# Patient Record
Sex: Male | Born: 1955 | Race: White | Hispanic: No | Marital: Single | Smoking: Former smoker
Health system: Southern US, Community
[De-identification: ages and names within clinical notes are randomized; demographics above are authoritative.]

## PROBLEM LIST (undated history)

## (undated) DIAGNOSIS — H53149 Visual discomfort, unspecified: Secondary | ICD-10-CM

## (undated) DIAGNOSIS — Z7901 Long term (current) use of anticoagulants: Secondary | ICD-10-CM

## (undated) DIAGNOSIS — I739 Peripheral vascular disease, unspecified: Secondary | ICD-10-CM

## (undated) DIAGNOSIS — S4990XA Unspecified injury of shoulder and upper arm, unspecified arm, initial encounter: Secondary | ICD-10-CM

## (undated) DIAGNOSIS — R0989 Other specified symptoms and signs involving the circulatory and respiratory systems: Secondary | ICD-10-CM

## (undated) DIAGNOSIS — M79641 Pain in right hand: Secondary | ICD-10-CM

## (undated) DIAGNOSIS — Z8371 Family history of colonic polyps: Secondary | ICD-10-CM

## (undated) DIAGNOSIS — Z83719 Family history of colon polyps, unspecified: Secondary | ICD-10-CM

## (undated) DIAGNOSIS — I447 Left bundle-branch block, unspecified: Secondary | ICD-10-CM

## (undated) DIAGNOSIS — F419 Anxiety disorder, unspecified: Secondary | ICD-10-CM

## (undated) DIAGNOSIS — M48061 Spinal stenosis, lumbar region without neurogenic claudication: Secondary | ICD-10-CM

## (undated) DIAGNOSIS — F329 Major depressive disorder, single episode, unspecified: Secondary | ICD-10-CM

## (undated) DIAGNOSIS — Z87898 Personal history of other specified conditions: Secondary | ICD-10-CM

## (undated) DIAGNOSIS — Z1211 Encounter for screening for malignant neoplasm of colon: Secondary | ICD-10-CM

## (undated) DIAGNOSIS — Z87891 Personal history of nicotine dependence: Secondary | ICD-10-CM

## (undated) DIAGNOSIS — I10 Essential (primary) hypertension: Secondary | ICD-10-CM

## (undated) DIAGNOSIS — J449 Chronic obstructive pulmonary disease, unspecified: Secondary | ICD-10-CM

## (undated) DIAGNOSIS — I6522 Occlusion and stenosis of left carotid artery: Secondary | ICD-10-CM

## (undated) DIAGNOSIS — G4700 Insomnia, unspecified: Secondary | ICD-10-CM

## (undated) DIAGNOSIS — R918 Other nonspecific abnormal finding of lung field: Secondary | ICD-10-CM

## (undated) DIAGNOSIS — E785 Hyperlipidemia, unspecified: Secondary | ICD-10-CM

## (undated) DIAGNOSIS — F32A Depression, unspecified: Secondary | ICD-10-CM

## (undated) HISTORY — DX: Insomnia, unspecified: G47.00

## (undated) HISTORY — DX: Unspecified injury of shoulder and upper arm, unspecified arm, initial encounter: S49.90XA

## (undated) HISTORY — DX: Depression, unspecified: F32.A

## (undated) HISTORY — DX: Spinal stenosis, lumbar region without neurogenic claudication: M48.061

## (undated) HISTORY — DX: Family history of colonic polyps: Z83.71

## (undated) HISTORY — DX: Pain in right hand: M79.641

## (undated) HISTORY — DX: Hyperlipidemia, unspecified: E78.5

## (undated) HISTORY — DX: Major depressive disorder, single episode, unspecified: F32.9

## (undated) HISTORY — DX: Encounter for screening for malignant neoplasm of colon: Z12.11

## (undated) HISTORY — DX: Anxiety disorder, unspecified: F41.9

## (undated) HISTORY — DX: Family history of colon polyps, unspecified: Z83.719

## (undated) HISTORY — DX: Visual discomfort, unspecified: H53.149

## (undated) HISTORY — DX: Personal history of other specified conditions: Z87.898

## (undated) HISTORY — DX: Essential (primary) hypertension: I10

---
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
## 1974-11-03 DIAGNOSIS — S4990XA Unspecified injury of shoulder and upper arm, unspecified arm, initial encounter: Secondary | ICD-10-CM

## 1974-11-03 HISTORY — DX: Unspecified injury of shoulder and upper arm, unspecified arm, initial encounter: S49.90XA

## 1977-11-03 HISTORY — PX: KNEE SURGERY: SHX244

## 2002-08-06 ENCOUNTER — Encounter: Payer: Self-pay | Admitting: Neurosurgery

## 2002-08-06 ENCOUNTER — Ambulatory Visit (HOSPITAL_COMMUNITY): Admission: RE | Admit: 2002-08-06 | Discharge: 2002-08-06 | Payer: Self-pay | Admitting: Neurosurgery

## 2003-04-25 ENCOUNTER — Encounter: Admission: RE | Admit: 2003-04-25 | Discharge: 2003-04-25 | Payer: Self-pay | Admitting: Internal Medicine

## 2003-05-02 ENCOUNTER — Encounter: Admission: RE | Admit: 2003-05-02 | Discharge: 2003-05-02 | Payer: Self-pay | Admitting: Internal Medicine

## 2003-06-19 ENCOUNTER — Encounter: Admission: RE | Admit: 2003-06-19 | Discharge: 2003-06-19 | Payer: Self-pay | Admitting: Internal Medicine

## 2003-08-07 ENCOUNTER — Encounter: Admission: RE | Admit: 2003-08-07 | Discharge: 2003-08-07 | Payer: Self-pay | Admitting: Internal Medicine

## 2003-09-27 ENCOUNTER — Encounter: Admission: RE | Admit: 2003-09-27 | Discharge: 2003-09-27 | Payer: Self-pay | Admitting: Internal Medicine

## 2003-11-23 ENCOUNTER — Encounter: Admission: RE | Admit: 2003-11-23 | Discharge: 2003-11-23 | Payer: Self-pay | Admitting: Internal Medicine

## 2004-04-12 ENCOUNTER — Encounter: Admission: RE | Admit: 2004-04-12 | Discharge: 2004-04-12 | Payer: Self-pay | Admitting: Internal Medicine

## 2004-06-24 ENCOUNTER — Encounter: Admission: RE | Admit: 2004-06-24 | Discharge: 2004-06-24 | Payer: Self-pay | Admitting: Internal Medicine

## 2004-08-12 ENCOUNTER — Ambulatory Visit: Payer: Self-pay | Admitting: Internal Medicine

## 2005-11-03 DIAGNOSIS — I10 Essential (primary) hypertension: Secondary | ICD-10-CM

## 2005-11-03 HISTORY — PX: CARDIAC CATHETERIZATION: SHX172

## 2005-11-03 HISTORY — DX: Essential (primary) hypertension: I10

## 2006-07-14 ENCOUNTER — Ambulatory Visit: Payer: Self-pay | Admitting: Internal Medicine

## 2006-07-15 ENCOUNTER — Ambulatory Visit: Payer: Self-pay | Admitting: Internal Medicine

## 2006-07-30 ENCOUNTER — Ambulatory Visit: Payer: Self-pay | Admitting: Cardiovascular Disease

## 2006-10-08 ENCOUNTER — Ambulatory Visit: Payer: Self-pay | Admitting: Internal Medicine

## 2006-10-08 IMAGING — US ABDOMEN ULTRASOUND
1 series · 17 of 25 positions shown · non-contrast
Comparison: none

REASON FOR EXAM: Pain upper abd
COMMENTS:

[Series 1: abdomen ultrasound · 17 of 75 slices shown]
[im 1/75]
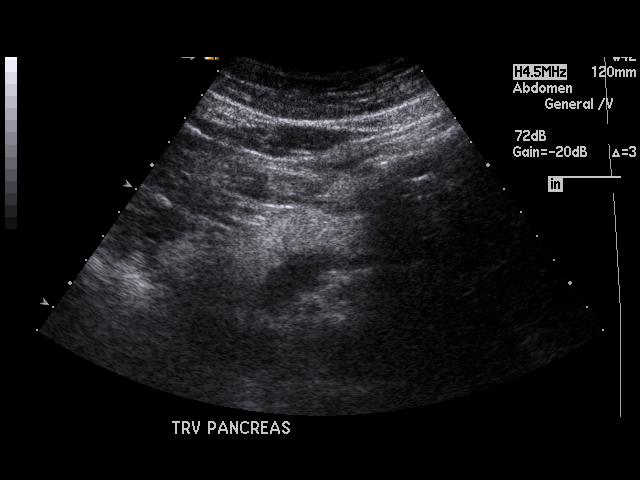
[im 7/75]
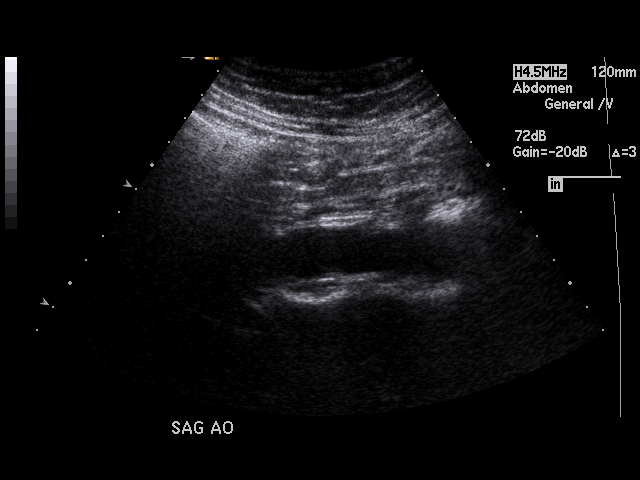
[im 10/75]
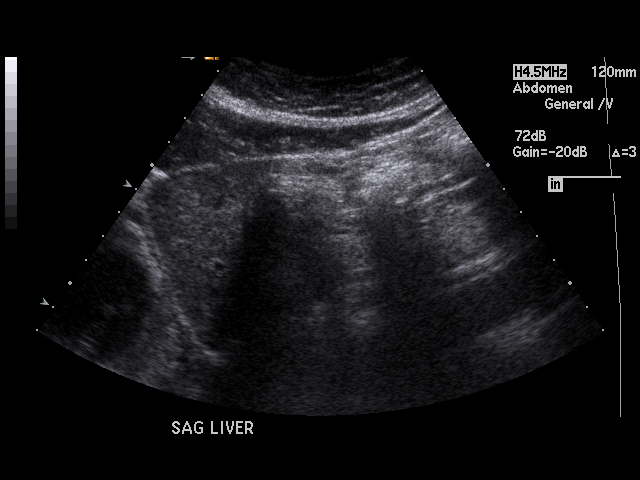
[im 16/75]
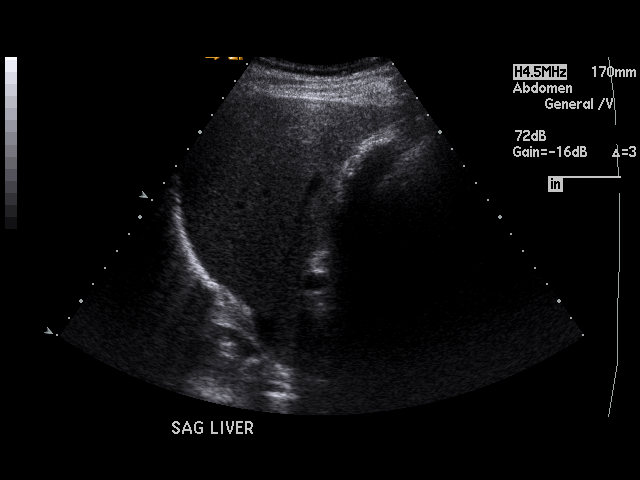
[im 19/75]
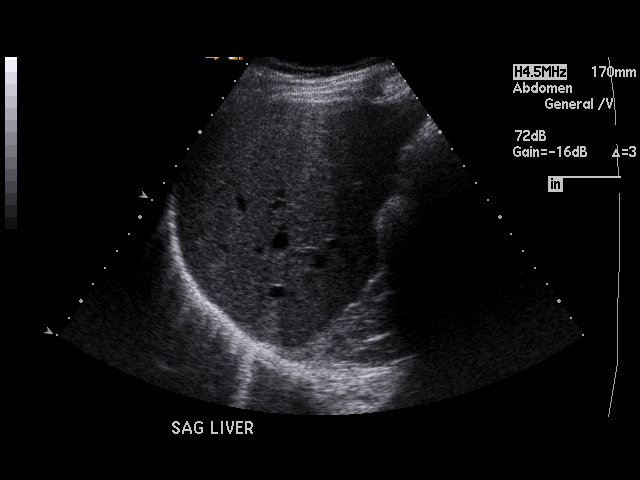
[im 25/75]
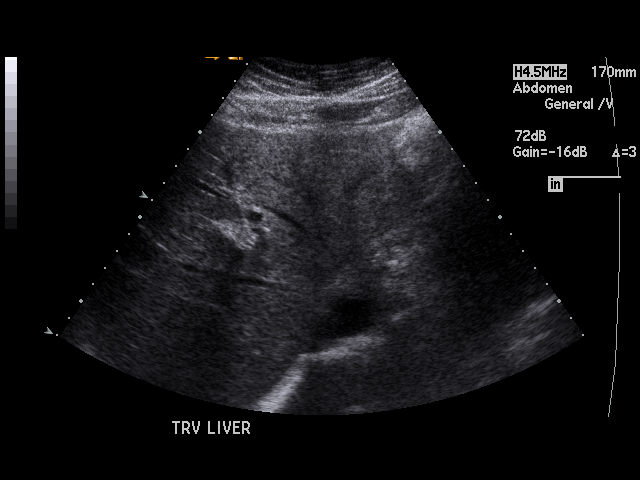
[im 28/75]
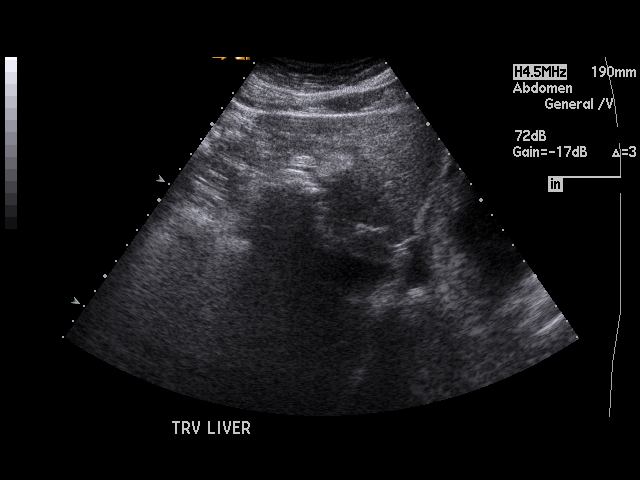
[im 34/75]
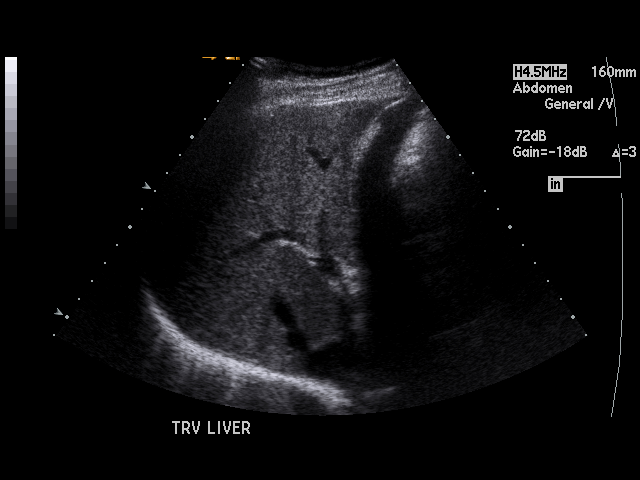
[im 38/75]
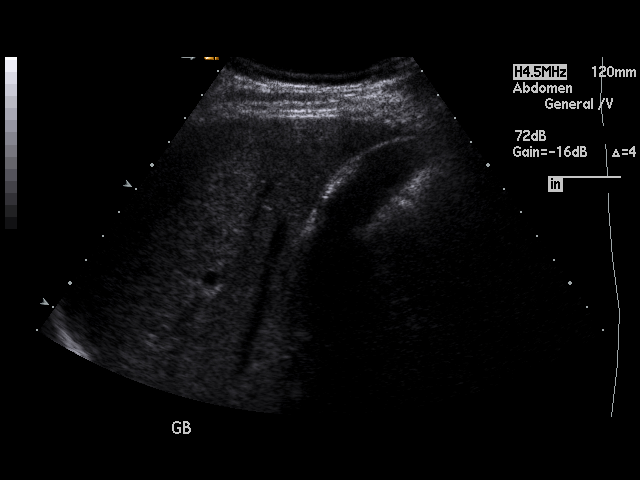
[im 41/75]
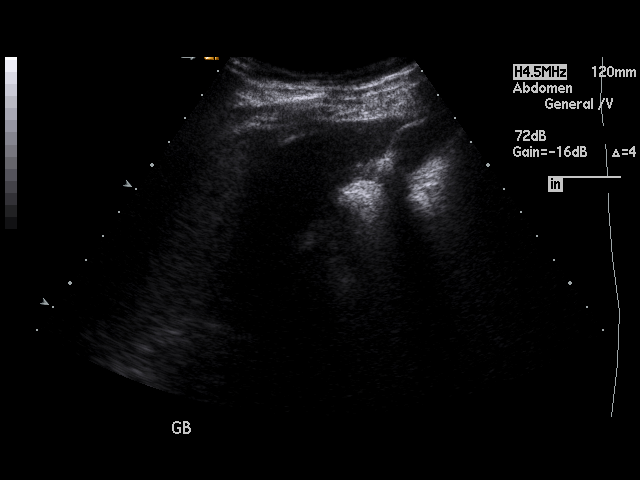
[im 47/75]
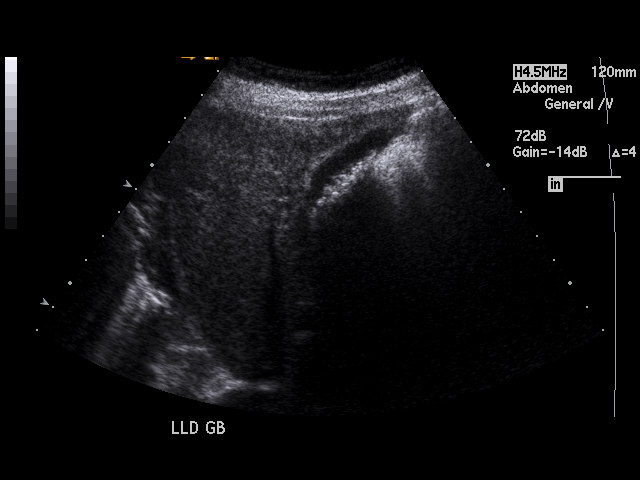
[im 50/75]
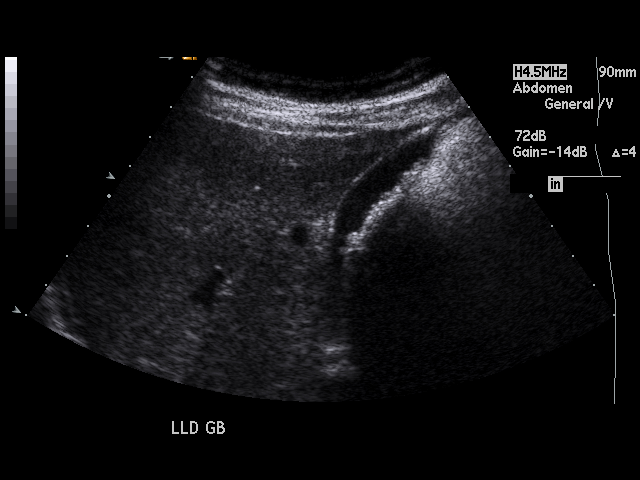
[im 56/75]
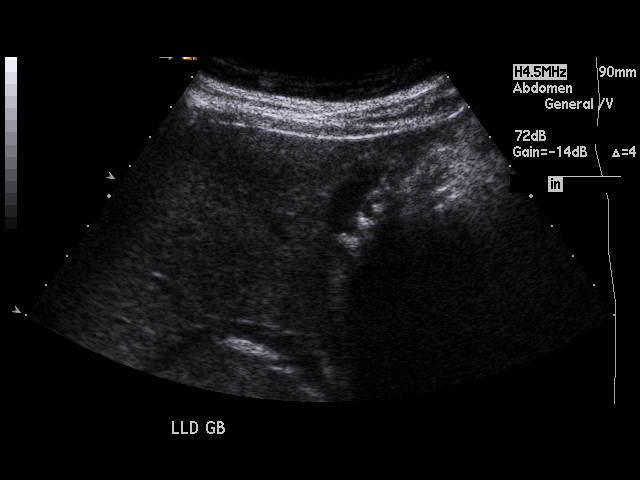
[im 59/75]
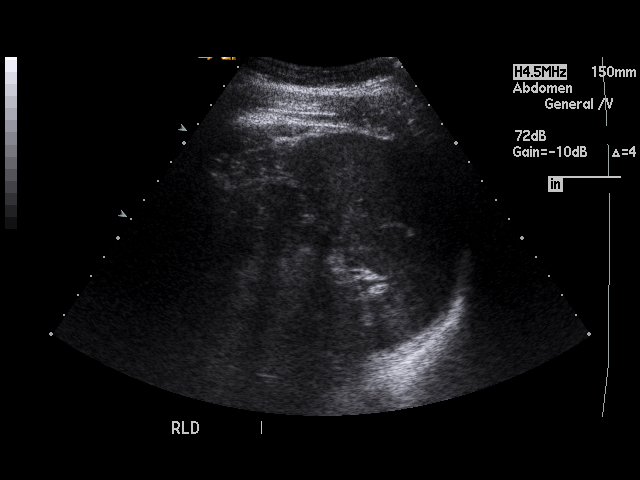
[im 65/75]
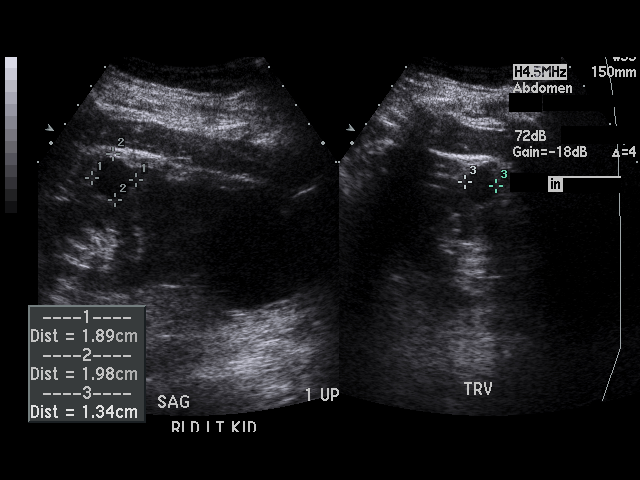
[im 68/75]
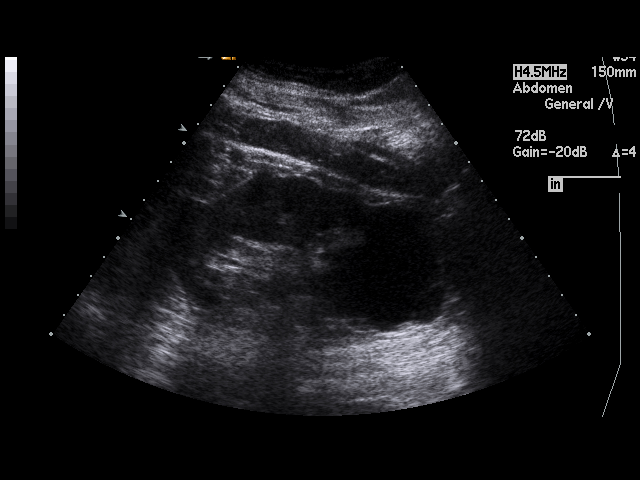
[im 75/75]
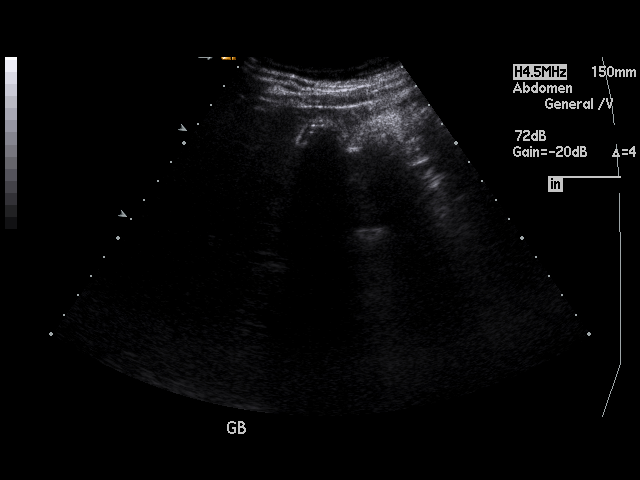

[17 of 25 positions shown; findings below may reference images not displayed]

PROCEDURE:     US  - US ABDOMEN GENERAL SURVEY  - [DATE]  [DATE]

RESULT:       The liver and spleen are normal in appearance.  A portion of
the pancreatic tail is obscured but otherwise the pancreas is normal in
appearance.  The abdominal aorta shows no significant abnormalities.  There
are noted echo densities in the gallbladder compatible with gallstones.  No
thickening of the gallbladder wall is seen.  The common bile duct measures
4.1 mm in diameter which is within normal limits.  The kidneys show no
hydronephrosis.  There is a 5.54 cm septated cyst of the lower pole of the
LEFT kidney.   There is a 1.98 cm cyst of the upper pole of the RIGHT
kidney.   No hydronephrosis is seen.
IMPRESSION: 1.     Cholelithiasis.
2.     Bilateral renal cysts are noted.

## 2009-08-17 ENCOUNTER — Ambulatory Visit: Payer: Self-pay

## 2011-11-04 HISTORY — PX: COLONOSCOPY: SHX174

## 2011-11-04 HISTORY — PX: POLYPECTOMY: SHX149

## 2011-12-30 ENCOUNTER — Ambulatory Visit: Payer: Self-pay | Admitting: General Surgery

## 2012-01-01 LAB — PATHOLOGY REPORT

## 2012-03-17 ENCOUNTER — Emergency Department: Payer: Self-pay | Admitting: Unknown Physician Specialty

## 2012-03-17 LAB — URINALYSIS, COMPLETE
Bacteria: NONE SEEN
Bilirubin,UR: NEGATIVE
Blood: NEGATIVE
Glucose,UR: NEGATIVE mg/dL
Ketone: NEGATIVE
Leukocyte Esterase: NEGATIVE
Nitrite: NEGATIVE
Ph: 6
Protein: NEGATIVE
RBC,UR: <1 /HPF
Specific Gravity: 1.004
Squamous Epithelial: NONE SEEN
WBC UR: <1 /HPF

## 2012-03-17 LAB — COMPREHENSIVE METABOLIC PANEL
Alkaline Phosphatase: 69 U/L (ref 50–136)
BUN: 6 mg/dL — ABNORMAL LOW (ref 7–18)
Bilirubin,Total: 0.5 mg/dL (ref 0.2–1.0)
Chloride: 93 mmol/L — ABNORMAL LOW (ref 98–107)
Co2: 27 mmol/L (ref 21–32)
Creatinine: 0.77 mg/dL (ref 0.60–1.30)
Glucose: 101 mg/dL — ABNORMAL HIGH (ref 65–99)
Osmolality: 247 (ref 275–301)
SGPT (ALT): 16 U/L

## 2012-03-17 LAB — CBC
HCT: 44.4 %
HGB: 15.2 g/dL
MCH: 29.9 pg
MCHC: 34.3 g/dL
MCV: 87 fL
Platelet: 236 x10 3/mm 3
RBC: 5.1 x10 6/mm 3
RDW: 13.2 %
WBC: 7.7 x10 3/mm 3

## 2012-03-17 LAB — TROPONIN I: Troponin-I: <0.02 ng/mL

## 2012-03-17 LAB — CK TOTAL AND CKMB (NOT AT ARMC)
CK, Total: 55 U/L (ref 35–232)
CK-MB: 0.5 ng/mL (ref 0.5–3.6)

## 2012-04-08 ENCOUNTER — Other Ambulatory Visit: Payer: Self-pay | Admitting: Internal Medicine

## 2012-04-08 LAB — COMPREHENSIVE METABOLIC PANEL
Albumin: 4.1 g/dL (ref 3.4–5.0)
Alkaline Phosphatase: 92 U/L (ref 50–136)
Anion Gap: 7 (ref 7–16)
BUN: 7 mg/dL (ref 7–18)
Bilirubin,Total: 0.4 mg/dL (ref 0.2–1.0)
Calcium, Total: 9 mg/dL (ref 8.5–10.1)
Chloride: 105 mmol/L (ref 98–107)
Co2: 27 mmol/L (ref 21–32)
Creatinine: 0.97 mg/dL (ref 0.60–1.30)
EGFR (African American): >60
EGFR (Non-African Amer.): >60
Glucose: 88 mg/dL (ref 65–99)
Osmolality: 275 (ref 275–301)
Potassium: 4.9 mmol/L (ref 3.5–5.1)
SGOT(AST): 15 U/L (ref 15–37)
SGPT (ALT): 15 U/L
Sodium: 139 mmol/L (ref 136–145)
Total Protein: 7.6 g/dL (ref 6.4–8.2)

## 2012-04-08 LAB — CBC WITH DIFFERENTIAL/PLATELET
Basophil #: 0.1 10*3/uL (ref 0.0–0.1)
Basophil %: 0.9 %
Eosinophil #: 0.1 10*3/uL (ref 0.0–0.7)
Eosinophil %: 1.8 %
HCT: 43.9 % (ref 40.0–52.0)
HGB: 14.6 g/dL (ref 13.0–18.0)
Lymphocyte #: 1.2 10*3/uL (ref 1.0–3.6)
Lymphocyte %: 19.1 %
MCH: 29.4 pg (ref 26.0–34.0)
MCHC: 33.3 g/dL (ref 32.0–36.0)
MCV: 88 fL (ref 80–100)
Monocyte #: 0.4 x10 3/mm (ref 0.2–1.0)
Monocyte %: 6.6 %
Neutrophil #: 4.5 10*3/uL (ref 1.4–6.5)
Neutrophil %: 71.6 %
Platelet: 213 10*3/uL (ref 150–440)
RBC: 4.96 10*6/uL (ref 4.40–5.90)
RDW: 13.1 % (ref 11.5–14.5)
WBC: 6.3 10*3/uL (ref 3.8–10.6)

## 2012-04-08 LAB — SEDIMENTATION RATE: Erythrocyte Sed Rate: 1 mm/hr (ref 0–20)

## 2012-05-26 ENCOUNTER — Emergency Department: Payer: Self-pay | Admitting: Emergency Medicine

## 2012-05-26 LAB — CK TOTAL AND CKMB (NOT AT ARMC)
CK, Total: 55 U/L (ref 35–232)
CK-MB: 0.7 ng/mL (ref 0.5–3.6)

## 2012-05-26 LAB — COMPREHENSIVE METABOLIC PANEL
Albumin: 4.1 g/dL (ref 3.4–5.0)
Alkaline Phosphatase: 86 U/L (ref 50–136)
Anion Gap: 11 (ref 7–16)
BUN: 5 mg/dL — ABNORMAL LOW (ref 7–18)
Bilirubin,Total: 0.6 mg/dL (ref 0.2–1.0)
Calcium, Total: 9 mg/dL (ref 8.5–10.1)
Chloride: 96 mmol/L — ABNORMAL LOW (ref 98–107)
Co2: 24 mmol/L (ref 21–32)
Creatinine: 1.02 mg/dL (ref 0.60–1.30)
EGFR (African American): >60
EGFR (Non-African Amer.): >60
Glucose: 125 mg/dL — ABNORMAL HIGH (ref 65–99)
Osmolality: 261 (ref 275–301)
Potassium: 3.3 mmol/L — ABNORMAL LOW (ref 3.5–5.1)
SGOT(AST): 9 U/L — ABNORMAL LOW (ref 15–37)
SGPT (ALT): 14 U/L
Sodium: 131 mmol/L — ABNORMAL LOW (ref 136–145)
Total Protein: 7.3 g/dL (ref 6.4–8.2)

## 2012-05-26 LAB — URINALYSIS, COMPLETE
Bacteria: NONE SEEN
Bilirubin,UR: NEGATIVE
Blood: NEGATIVE
Glucose,UR: NEGATIVE mg/dL (ref 0–75)
Hyaline Cast: 3
Leukocyte Esterase: NEGATIVE
Nitrite: NEGATIVE
Ph: 6 (ref 4.5–8.0)
Protein: NEGATIVE
RBC,UR: NONE SEEN /HPF (ref 0–5)
Specific Gravity: 1.004 (ref 1.003–1.030)
Squamous Epithelial: NONE SEEN
WBC UR: <1 /HPF (ref 0–5)

## 2012-05-26 LAB — CBC
HCT: 45.9 % (ref 40.0–52.0)
HGB: 15.1 g/dL (ref 13.0–18.0)
MCH: 29 pg (ref 26.0–34.0)
MCHC: 33 g/dL (ref 32.0–36.0)
MCV: 88 fL (ref 80–100)
Platelet: 201 10*3/uL (ref 150–440)
RBC: 5.22 10*6/uL (ref 4.40–5.90)
RDW: 13.7 % (ref 11.5–14.5)
WBC: 6.9 10*3/uL (ref 3.8–10.6)

## 2012-05-26 LAB — PROTIME-INR
INR: 0.9
Prothrombin Time: 12.3 secs (ref 11.5–14.7)

## 2012-05-26 LAB — TROPONIN I: Troponin-I: <0.02 ng/mL

## 2013-05-19 ENCOUNTER — Encounter: Payer: Self-pay | Admitting: *Deleted

## 2015-10-09 ENCOUNTER — Ambulatory Visit (INDEPENDENT_AMBULATORY_CARE_PROVIDER_SITE_OTHER): Payer: Medicaid Other | Admitting: Family Medicine

## 2015-10-09 ENCOUNTER — Encounter: Payer: Self-pay | Admitting: Family Medicine

## 2015-10-09 ENCOUNTER — Ambulatory Visit
Admission: RE | Admit: 2015-10-09 | Discharge: 2015-10-09 | Disposition: A | Discharge status: Home / Self Care | Payer: Medicaid Other | Source: Ambulatory Visit | Attending: Family Medicine | Admitting: Family Medicine

## 2015-10-09 VITALS — BP 150/70 | HR 104 | Temp 98.2°F | Resp 18 | Ht 66.0 in | Wt 215.5 lb

## 2015-10-09 DIAGNOSIS — M7989 Other specified soft tissue disorders: Secondary | ICD-10-CM | POA: Insufficient documentation

## 2015-10-09 DIAGNOSIS — R0609 Other forms of dyspnea: Principal | ICD-10-CM

## 2015-10-09 DIAGNOSIS — R0600 Dyspnea, unspecified: Secondary | ICD-10-CM | POA: Diagnosis present

## 2015-10-09 DIAGNOSIS — I10 Essential (primary) hypertension: Secondary | ICD-10-CM | POA: Insufficient documentation

## 2015-10-09 DIAGNOSIS — I447 Left bundle-branch block, unspecified: Secondary | ICD-10-CM

## 2015-10-09 MED ORDER — LISINOPRIL-HYDROCHLOROTHIAZIDE 10-12.5 MG PO TABS: 1.0000 | ORAL_TABLET | Freq: Every day | ORAL | Status: DC

## 2015-10-09 NOTE — Progress Notes (Signed)
Name: James [name]   MRN: [id number]    DOB: 11/05/1955   Date:10/09/2015       Progress Note  Subjective  Chief Complaint  Chief Complaint  Patient presents with  . Establish Care  . Shortness of Breath    has has a cough since Thanksgiving day    Shortness of Breath This is a chronic problem. The current episode started more than 1 year ago (2 years ago). Associated symptoms include leg swelling (right leg swells up sometimes.), sputum production and wheezing. Pertinent negatives include no chest pain (chest wall pain with movement), fever, hemoptysis, leg pain or sore throat. Nothing aggravates the symptoms. There is no history of CAD, chronic lung disease, COPD, a heart failure, pneumonia or a recent surgery.    Past Medical History  Diagnosis Date  . Hypertension 2007  . Family history of colonic polyps   . Special screening for malignant neoplasms, colon   . Depression     history of   . History of palpitations     intermittent; patient states heart skips a beat at times  . Hyperlipidemia   . 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 Shoulder injury 1976  . Anxiety     history of  . Hand pain, right   . Spinal stenosis of lumbar region   . Insomnia     Past Surgical History  Procedure Laterality Date  . Colonoscopy  2013  . Tonsillectomy  1964  . Knee surgery Left 1979  . Polypectomy  2013    Family History  Problem Relation Age of Onset  . Colon cancer Father   . Cancer Father     colon  . Transient ischemic attack Father     history of  . Arthritis Mother   . Heart failure Mother   . Hypertension Mother   . Kidney failure Mother     Social History   Social History  . Marital Status: Single    Spouse Name: N/A  . Number of Children: N/A  . Years of Education: N/A   Occupational History  . Not on file.   Social History Main Topics  . Smoking status: Former Smoker -- 1.00 packs/day for 25 years   . Smokeless tobacco: Never Used  . Alcohol Use: No  . Drug Use: No  . Sexual Activity: No   Other Topics Concern  . Not on file   Social History Narrative    No current outpatient prescriptions on file.  Allergies  Allergen Reactions  . Naprosyn [Naproxen] Itching     Review of Systems  Constitutional: Negative for fever.  HENT: Negative for sore throat.   Respiratory: Positive for cough, sputum production, shortness of breath and wheezing. Negative for hemoptysis.   Cardiovascular: Positive for leg swelling (right leg swells up sometimes.). Negative for chest pain (chest wall pain with movement).    Objective  Filed Vitals:   10/09/15 1335  BP: 150/70  Pulse: 104  Temp: 98.2 F (36.8 C)  TempSrc: Oral  Resp: 18  Height: 5\' 6"  (1.676 m)  Weight: 215 lb 8 oz (97.75 kg)  SpO2: 98%    Physical Exam  Constitutional: He is oriented to person, place, and time and well-developed, well-nourished, and in no distress.  HENT:  Right Ear: Ear canal normal.  Mouth/Throat: Oropharynx is clear and moist.  R. Ear canal has cerumen impaction.  Neck: Neck  supple.  Cardiovascular: Regular rhythm, S1 normal, S2 normal and normal heart sounds.  Tachycardia present.   Pulmonary/Chest: He has no decreased breath sounds. He has no wheezes. He has rales in the right middle field and the right lower field.  Abdominal: Soft. Bowel sounds are normal. There is no tenderness.  Musculoskeletal:       Right knee: He exhibits swelling. No tenderness found.       Left knee: He exhibits no swelling. No tenderness found.       Right lower leg: He exhibits swelling.  Neurological: He is oriented to person, place, and time.  Nursing note and vitals reviewed.   Assessment & Plan  1. Dyspnea on effort Obtain laboratory evaluation, EKG, and chest x-ray to rule out potential etiologies. - CBC with Differential - Comprehensive Metabolic Panel (CMET) - DG Chest 2 View; Future - EKG  12-Lead  2. Essential hypertension BP elevated. Patient has been on a beta blocker in the past. We'll start on lisinopril/HCTZ and recheck BP in one month. - lisinopril-hydrochlorothiazide (PRINZIDE,ZESTORETIC) 10-12.5 MG tablet; Take 1 tablet by mouth daily.  Dispense: 30 tablet; Refill: 0  3. Right leg swelling Likely dependent edema but will rule out DVT with ultrasound. - US Venous Img Lower Unilateral Right; Future  4. LBBB (left bundle branch block)  - Ambulatory referral to Cardiology   [facility] 10/09/2015 2:11 PM

## 2015-10-11 LAB — CBC WITH DIFFERENTIAL/PLATELET
BASOS: 1 %
Basophils Absolute: 0 10*3/uL (ref 0.0–0.2)
EOS (ABSOLUTE): 0.4 10*3/uL (ref 0.0–0.4)
EOS: 5 %
HEMATOCRIT: 41.7 % (ref 37.5–51.0)
HEMOGLOBIN: 14.4 g/dL (ref 12.6–17.7)
IMMATURE GRANULOCYTES: 1 %
Immature Grans (Abs): 0 10*3/uL (ref 0.0–0.1)
LYMPHS ABS: 1.7 10*3/uL (ref 0.7–3.1)
Lymphs: 20 %
MCH: 29.1 pg (ref 26.6–33.0)
MCHC: 34.5 g/dL (ref 31.5–35.7)
MCV: 84 fL (ref 79–97)
MONOCYTES: 7 %
Monocytes Absolute: 0.6 10*3/uL (ref 0.1–0.9)
Neutrophils Absolute: 5.6 10*3/uL (ref 1.4–7.0)
Neutrophils: 66 %
Platelets: 257 10*3/uL (ref 150–379)
RBC: 4.95 x10E6/uL (ref 4.14–5.80)
RDW: 14.8 % (ref 12.3–15.4)
WBC: 8.3 10*3/uL (ref 3.4–10.8)

## 2015-10-11 LAB — COMPREHENSIVE METABOLIC PANEL
ALBUMIN: 4 g/dL (ref 3.6–4.8)
ALT: 20 IU/L (ref 0–44)
AST: 20 IU/L (ref 0–40)
Albumin/Globulin Ratio: 1.7 (ref 1.1–2.5)
Alkaline Phosphatase: 77 IU/L (ref 39–117)
BUN / CREAT RATIO: 19 (ref 10–22)
BUN: 20 mg/dL (ref 8–27)
Bilirubin Total: 0.3 mg/dL (ref 0.0–1.2)
CALCIUM: 9.3 mg/dL (ref 8.6–10.2)
CO2: 23 mmol/L (ref 18–29)
Chloride: 101 mmol/L (ref 97–106)
Creatinine, Ser: 1.07 mg/dL (ref 0.76–1.27)
GFR calc Af Amer: 87 mL/min/{1.73_m2} (ref >59)
GFR calc non Af Amer: 75 mL/min/{1.73_m2} (ref >59)
GLOBULIN, TOTAL: 2.4 g/dL (ref 1.5–4.5)
Glucose: 110 mg/dL — ABNORMAL HIGH (ref 65–99)
Potassium: 4.7 mmol/L (ref 3.5–5.2)
SODIUM: 141 mmol/L (ref 136–144)
Total Protein: 6.4 g/dL (ref 6.0–8.5)

## 2015-10-12 ENCOUNTER — Other Ambulatory Visit: Payer: Self-pay | Admitting: Family Medicine

## 2015-10-12 ENCOUNTER — Ambulatory Visit
Admission: RE | Admit: 2015-10-12 | Discharge: 2015-10-12 | Disposition: A | Discharge status: Home / Self Care | Payer: Disability Insurance | Source: Ambulatory Visit | Attending: Family Medicine | Admitting: Family Medicine

## 2015-10-12 DIAGNOSIS — M5136 Other intervertebral disc degeneration, lumbar region: Secondary | ICD-10-CM | POA: Diagnosis not present

## 2015-10-12 DIAGNOSIS — M47816 Spondylosis without myelopathy or radiculopathy, lumbar region: Secondary | ICD-10-CM | POA: Insufficient documentation

## 2015-10-12 DIAGNOSIS — M545 Low back pain: Secondary | ICD-10-CM | POA: Insufficient documentation

## 2015-10-12 DIAGNOSIS — M549 Dorsalgia, unspecified: Secondary | ICD-10-CM

## 2015-11-13 ENCOUNTER — Encounter: Payer: Self-pay | Admitting: Family Medicine

## 2015-11-13 ENCOUNTER — Ambulatory Visit (INDEPENDENT_AMBULATORY_CARE_PROVIDER_SITE_OTHER): Payer: Medicaid Other | Admitting: Family Medicine

## 2015-11-13 VITALS — BP 132/84 | HR 104 | Temp 98.6°F | Resp 16 | Ht 67.0 in | Wt 218.8 lb

## 2015-11-13 DIAGNOSIS — I10 Essential (primary) hypertension: Secondary | ICD-10-CM

## 2015-11-13 DIAGNOSIS — Z23 Encounter for immunization: Secondary | ICD-10-CM

## 2015-11-13 DIAGNOSIS — F411 Generalized anxiety disorder: Secondary | ICD-10-CM

## 2015-11-13 MED ORDER — ALPRAZOLAM 0.5 MG PO TABS: 0.5000 mg | ORAL_TABLET | Freq: Two times a day (BID) | ORAL | Status: DC | PRN

## 2015-11-13 NOTE — Progress Notes (Signed)
Name: James [name]   MRN: [id number]    DOB: 07/31/1956   Date:11/13/2015       Progress Note  Subjective  Chief Complaint  Chief Complaint  Patient presents with  . Medication Refill  . Hypertension    stopped lisinopril/hctz due side effects of cough and rash.  . Anxiety    anxious and nervous about everything. Stressed    Hypertension This is a chronic problem. The problem is unchanged. The problem is controlled. Associated symptoms include anxiety. Pertinent negatives include no chest pain, headaches, orthopnea or palpitations. Past treatments include ACE inhibitors and diuretics. Compliance problems include medication side effects (Stopped Due to cough, constipation.).  There is no history of kidney disease, CAD/MI or CVA.  Anxiety Presents for initial visit. Symptoms include depressed mood, dizziness, excessive worry (worried about everything), malaise and nervous/anxious behavior (stay nervous all the time). Patient reports no chest pain or palpitations.   His past medical history is significant for anxiety/panic attacks and depression. Past treatments include nothing.    Past Medical History  Diagnosis Date  . Hypertension 2007  . Family history of colonic polyps   . Special screening for malignant neoplasms, colon   . Depression     history of   . History of palpitations     intermittent; patient states heart skips a beat at times  . Hyperlipidemia   . 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 Shoulder injury 1976  . Anxiety     history of  . Hand pain, right   . Spinal stenosis of lumbar region   . Insomnia     Past Surgical History  Procedure Laterality Date  . Colonoscopy  2013  . Tonsillectomy  1964  . Knee surgery Left 1979  . Polypectomy  2013    Family History  Problem Relation Age of Onset  . Colon cancer Father   . Cancer Father     colon  . Transient ischemic attack Father     history of  .  Arthritis Mother   . Heart failure Mother   . Hypertension Mother   . Kidney failure Mother     Social History   Social History  . Marital Status: Single    Spouse Name: N/A  . Number of Children: N/A  . Years of Education: N/A   Occupational History  . Not on file.   Social History Main Topics  . Smoking status: Former Smoker -- 1.00 packs/day for 25 years  . Smokeless tobacco: Never Used  . Alcohol Use: No  . Drug Use: No  . Sexual Activity: No   Other Topics Concern  . Not on file   Social History Narrative     Current outpatient prescriptions:  .  meloxicam (MOBIC) 15 MG tablet, Take 15 mg by mouth daily., Disp: , Rfl:  .  methocarbamol (ROBAXIN) 500 MG tablet, Take 500 mg by mouth 2 (two) times daily., Disp: , Rfl:   Allergies  Allergen Reactions  . Naprosyn [Naproxen] Itching   Review of Systems  Cardiovascular: Negative for chest pain, palpitations and orthopnea.  Neurological: Positive for dizziness. Negative for headaches.  Psychiatric/Behavioral: Negative for depression. The patient is nervous/anxious (stay nervous all the time).      Objective  Filed Vitals:   11/13/15 1607  BP: 124/82  Pulse: 104  Temp: 98.6 F (37 C)  TempSrc: Oral  Resp: 16  Height: 5\' 7"  (1.702 m)  Weight: 218 lb 12.8 oz (99.247 kg)  SpO2: 96%    Physical Exam  Constitutional: He is oriented to person, place, and time and well-developed, well-nourished, and in no distress.  Cardiovascular: Normal rate and regular rhythm.   Pulmonary/Chest: Effort normal and breath sounds normal.  Abdominal: Soft. Bowel sounds are normal.  Neurological: He is alert and oriented to person, place, and time.  Psychiatric: Memory, affect and judgment normal.  Nursing note and vitals reviewed.  Assessment & Plan  1. Generalized anxiety disorder We'll start on Xanax 0.5 mg twice a day when necessary for anxiety. Patient follow-up in one month. - ALPRAZolam (XANAX) 0.5 MG tablet; Take 1  tablet (0.5 mg total) by mouth 2 (two) times daily as needed for anxiety.  Dispense: 60 tablet; Refill: 0  2. Needs flu shot  - Flu Vaccine QUAD 36+ mos PF IM (Fluarix & Fluzone Quad PF)  3. Essential hypertension BP stable and controlled. Patient is off medication. Continue to monitor and follow-up in one month.  James [name] Asad [facility] 11/13/2015 4:16 PM

## 2015-12-06 ENCOUNTER — Ambulatory Visit (INDEPENDENT_AMBULATORY_CARE_PROVIDER_SITE_OTHER): Payer: Medicaid Other | Admitting: Cardiovascular Disease

## 2015-12-06 ENCOUNTER — Encounter: Payer: Self-pay | Admitting: Cardiovascular Disease

## 2015-12-06 VITALS — BP 150/90 | HR 92 | Ht 66.0 in | Wt 217.5 lb

## 2015-12-06 DIAGNOSIS — R0602 Shortness of breath: Secondary | ICD-10-CM | POA: Diagnosis not present

## 2015-12-06 DIAGNOSIS — I10 Essential (primary) hypertension: Secondary | ICD-10-CM

## 2015-12-06 DIAGNOSIS — R000 Tachycardia, unspecified: Secondary | ICD-10-CM

## 2015-12-06 DIAGNOSIS — R0609 Other forms of dyspnea: Secondary | ICD-10-CM

## 2015-12-06 MED ORDER — CARVEDILOL 6.25 MG PO TABS: 6.2500 mg | ORAL_TABLET | Freq: Two times a day (BID) | ORAL | Status: DC

## 2015-12-06 NOTE — Assessment & Plan Note (Signed)
I think it's important to treat his elevated blood pressure. I elected to start him on small dose carvedilol especially that he is bothered by sinus tachycardia. An ARB can be added especially if ejection fraction is low.

## 2015-12-06 NOTE — Assessment & Plan Note (Signed)
His symptoms are concerning for possible cardiomyopathy related to left bundle branch block or angina equivalent. His symptoms are currently happening with minimal activities. I requested an echocardiogram to evaluate LV systolic function. If he has cardiomyopathy, then I recommend proceeding with cardiac catheterization to exclude obstructive coronary artery disease. If ejection fraction is normal, I recommend a pharmacologic nuclear stress test. He does have underlying left bundle branch block and a regular stress test is not diagnostic. By physical exam, I do not see clear evidence of fluid overload.

## 2015-12-06 NOTE — Patient Instructions (Signed)
Medication Instructions:  Your physician has recommended you make the following change in your medication:  START taking coreg 6.25mg  twice daily   Labwork: none  Testing/Procedures: Your physician has requested that you have an echocardiogram. Echocardiography is a painless test that uses sound waves to create images of your heart. It provides your doctor with information about the size and shape of your heart and how well your heart's chambers and valves are working. This procedure takes approximately one hour. There are no restrictions for this procedure.     Follow-Up: Your physician recommends that you schedule a follow-up appointment in: two months with Dr. Kirke Corin.    Any Other Special Instructions Will Be Listed Below (If Applicable).     If you need a refill on your cardiac medications before your next appointment, please call your pharmacy.  Echocardiogram An echocardiogram, or echocardiography, uses sound waves (ultrasound) to produce an image of your heart. The echocardiogram is simple, painless, obtained within a short period of time, and offers valuable information to your health care provider. The images from an echocardiogram can provide information such as:  Evidence of coronary artery disease (CAD).  Heart size.  Heart muscle function.  Heart valve function.  Aneurysm detection.  Evidence of a past heart attack.  Fluid buildup around the heart.  Heart muscle thickening.  Assess heart valve function. LET [facility] CARE PROVIDER KNOW ABOUT:  Any allergies you have.  All medicines you are taking, including vitamins, herbs, eye drops, creams, and over-the-counter medicines.  Previous problems you or members of your family have had with the use of anesthetics.  Any blood disorders you have.  Previous surgeries you have had.  Medical conditions you have.  Possibility of pregnancy, if this applies. BEFORE THE PROCEDURE  No special preparation is  needed. Eat and drink normally.  PROCEDURE   In order to produce an image of your heart, gel will be applied to your chest and a wand-like tool (transducer) will be moved over your chest. The gel will help transmit the sound waves from the transducer. The sound waves will harmlessly bounce off your heart to allow the heart images to be captured in real-time motion. These images will then be recorded.  You may need an IV to receive a medicine that improves the quality of the pictures. AFTER THE PROCEDURE You may return to your normal schedule including diet, activities, and medicines, unless your health care provider tells you otherwise.   This information is not intended to replace advice given to you by your health care provider. Make sure you discuss any questions you have with your health care provider.   Document Released: 10/17/2000 Document Revised: 11/10/2014 Document Reviewed: 06/27/2013 Elsevier Interactive Patient Education [employer]

## 2015-12-06 NOTE — Progress Notes (Signed)
Primary care physician: Dr. Quin Hoop  HPI  This is a pleasant 60 year old man who was referred by Dr. Sherryll Burger for evaluation of exertional dyspnea and left bundle branch block. The patient reports having cardiac workup done in 2007 by Dr.Masoud and Dr. Welton Flakes. He had cardiac catheterization done at that time and was told about no significant coronary artery disease. Records are not available. He has known history of hypertension which has been on intermittent treatment. Most recently, he was on lisinopril but discontinued the medication due to dry cough. He is a previous smoker and quit smoking about 16 years ago. He suffers from chronic back pain and has been on disability due to this. He also reports history of hyperlipidemia with intolerance to simvastatin lovastatin. He has no family history of premature coronary artery disease. He has known history of left bundle branch block going back as far as 2013. Over the last 1-2 years, has experienced gradual decline in functional capacity with significant exertional dyspnea which is currently happening with minimal activities. He does not describe any chest pain with this. No orthopnea, PND or significant leg edema. He does report a 35 pound weight gain over the last year. He hasn't been able to do much exercise due to his chronic back pain. He is not aware of history of sleep apnea. He reports being under significant stress lately which has also affect. He reports frequent tachycardia and palpitations. His heart rate was in the 60s in the past and now it seems to be running in the 90s and 100.  Allergies  Allergen Reactions  . Naprosyn [Naproxen] Itching     Current Outpatient Prescriptions on File Prior to Visit  Medication Sig Dispense Refill  . ALPRAZolam (XANAX) 0.5 MG tablet Take 1 tablet (0.5 mg total) by mouth 2 (two) times daily as needed for anxiety. 60 tablet 0  . meloxicam (MOBIC) 15 MG tablet Take 15 mg by mouth daily.    . methocarbamol  (ROBAXIN) 500 MG tablet Take 500 mg by mouth 2 (two) times daily.     No current facility-administered medications on file prior to visit.     Past Medical History  Diagnosis Date  . Hypertension 2007  . Family history of colonic polyps   . Special screening for malignant neoplasms, colon   . Depression     history of   . History of palpitations     intermittent; patient states heart skips a beat at times  . Hyperlipidemia   . 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 Shoulder injury 1976  . Anxiety     history of  . Hand pain, right   . Spinal stenosis of lumbar region   . Insomnia      Past Surgical History  Procedure Laterality Date  . Colonoscopy  2013  . Tonsillectomy  1964  . Knee surgery Left 1979  . Polypectomy  2013  . Cardiac catheterization  2007     Family History  Problem Relation Age of Onset  . Colon cancer Father   . Cancer Father     colon  . Transient ischemic attack Father     history of  . Arthritis Mother   . Heart failure Mother   . Hypertension Mother   . Kidney failure Mother      Social History   Social History  . Marital Status: Single    Spouse Name: N/A  .  Number of Children: N/A  . Years of Education: N/A   Occupational History  . Not on file.   Social History Main Topics  . Smoking status: Former Smoker -- 1.00 packs/day for 25 years  . Smokeless tobacco: Never Used  . Alcohol Use: No  . Drug Use: No  . Sexual Activity: No   Other Topics Concern  . Not on file   Social History Narrative     ROS A 10 point review of system was performed. It is negative other than that mentioned in the history of present illness.   PHYSICAL EXAM   BP 150/90 mmHg  Pulse 92  Ht  (1.676 m)  Wt 217 lb 8 oz (98.657 kg)  BMI 35.12 kg/m2 Constitutional: He is oriented to person, place, and time. He appears well-developed and well-nourished. No distress.  HENT: No nasal  discharge.  Head: Normocephalic and atraumatic.  Eyes: Pupils are equal and round.  No discharge. Neck: Normal range of motion. Neck supple. No JVD present. No thyromegaly present.  Cardiovascular: Normal rate, regular rhythm, normal heart sounds. Exam reveals no gallop and no friction rub. No murmur heard.  Pulmonary/Chest: Effort normal and breath sounds normal. No stridor. No respiratory distress. He has no wheezes. He has no rales. He exhibits no tenderness.  Abdominal: Soft. Bowel sounds are normal. He exhibits no distension. There is no tenderness. There is no rebound and no guarding.  Musculoskeletal: Normal range of motion. He exhibits no edema and no tenderness.  Neurological: He is alert and oriented to person, place, and time. Coordination normal.  Skin: Skin is warm and dry. No rash noted. He is not diaphoretic. No erythema. No pallor.  Psychiatric: He has a normal mood and affect. His behavior is normal. Judgment and thought content normal.       EKG: Normal sinus rhythm with left bundle branch block.   ASSESSMENT AND PLAN

## 2015-12-17 ENCOUNTER — Encounter: Payer: Self-pay | Admitting: Family Medicine

## 2015-12-17 ENCOUNTER — Ambulatory Visit (INDEPENDENT_AMBULATORY_CARE_PROVIDER_SITE_OTHER): Payer: Medicaid Other | Admitting: Family Medicine

## 2015-12-17 VITALS — BP 137/80 | HR 86 | Temp 98.1°F | Resp 19 | Ht 66.0 in | Wt 219.4 lb

## 2015-12-17 DIAGNOSIS — F411 Generalized anxiety disorder: Secondary | ICD-10-CM

## 2015-12-17 MED ORDER — ALPRAZOLAM 0.5 MG PO TABS: 0.5000 mg | ORAL_TABLET | Freq: Three times a day (TID) | ORAL | Status: DC | PRN

## 2015-12-17 NOTE — Progress Notes (Signed)
Name: James [name]   MRN: [id number]    DOB: May 18, 1956   Date:12/17/2015       Progress Note  Subjective  Chief Complaint  Chief Complaint  Patient presents with  . Follow-up    1 mo  . Hypertension  . Shortness of Breath  . Anxiety    Anxiety Presents for follow-up visit. The problem has been gradually improving. Symptoms include excessive worry, insomnia and nervous/anxious behavior.   Past treatments include benzodiazephines. The treatment provided significant relief. Compliance with prior treatments has been good.    Past Medical History  Diagnosis Date  . Hypertension 2007  . Family history of colonic polyps   . Special screening for malignant neoplasms, colon   . Depression     history of   . History of palpitations     intermittent; patient states heart skips a beat at times  . Hyperlipidemia   . 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 Shoulder injury 1976  . Anxiety     history of  . Hand pain, right   . Spinal stenosis of lumbar region   . Insomnia     Past Surgical History  Procedure Laterality Date  . Colonoscopy  2013  . Tonsillectomy  1964  . Knee surgery Left 1979  . Polypectomy  2013  . Cardiac catheterization  2007    Family History  Problem Relation Age of Onset  . Colon cancer Father   . Cancer Father     colon  . Transient ischemic attack Father     history of  . Arthritis Mother   . Heart failure Mother   . Hypertension Mother   . Kidney failure Mother     Social History   Social History  . Marital Status: Single    Spouse Name: N/A  . Number of Children: N/A  . Years of Education: N/A   Occupational History  . Not on file.   Social History Main Topics  . Smoking status: Former Smoker -- 1.00 packs/day for 25 years  . Smokeless tobacco: Never Used  . Alcohol Use: No  . Drug Use: No  . Sexual Activity: No   Other Topics Concern  . Not on file   Social History  Narrative     Current outpatient prescriptions:  .  ALPRAZolam (XANAX) 0.5 MG tablet, Take 1 tablet (0.5 mg total) by mouth 2 (two) times daily as needed for anxiety., Disp: 60 tablet, Rfl: 0 .  carvedilol (COREG) 6.25 MG tablet, Take 1 tablet (6.25 mg total) by mouth 2 (two) times daily., Disp: 60 tablet, Rfl: 3 .  meloxicam (MOBIC) 15 MG tablet, Take 15 mg by mouth daily., Disp: , Rfl:  .  methocarbamol (ROBAXIN) 500 MG tablet, Take 500 mg by mouth 2 (two) times daily., Disp: , Rfl:   Allergies  Allergen Reactions  . Naprosyn [Naproxen] Itching     Review of Systems  Psychiatric/Behavioral: The patient is nervous/anxious and has insomnia.     Objective  Filed Vitals:   12/17/15 1416  BP: 137/80  Pulse: 86  Temp: 98.1 F (36.7 C)  TempSrc: Oral  Resp: 19  Height:  (1.676 m)  Weight: 219 lb 6.4 oz (99.519 kg)  SpO2: 96%    Physical Exam  Constitutional: He is oriented to person, place, and time and well-developeJAQUAE RIEVESrished, and in no distress.  Cardiovascular: Normal rate and regular  rhythm.   Pulmonary/Chest: Effort normal and breath sounds normal.  Neurological: He is alert and oriented to person, place, and time.  Psychiatric: Mood, memory, affect and judgment normal.  Nursing note and vitals reviewed.     Assessment & Plan  1. Generalized anxiety disorder Improvement in symptoms, will increase frequency to three times daily as needed. - ALPRAZolam (XANAX) 0.5 MG tablet; Take 1 tablet (0.5 mg total) by mouth 3 (three) times daily as needed for anxiety.  Dispense: 90 tablet; Refill: 0   Janaye Corp Asad [facility] 12/17/2015 2:36 PM

## 2015-12-18 ENCOUNTER — Ambulatory Visit (INDEPENDENT_AMBULATORY_CARE_PROVIDER_SITE_OTHER): Payer: Medicaid Other

## 2015-12-18 ENCOUNTER — Other Ambulatory Visit: Payer: Self-pay

## 2015-12-18 DIAGNOSIS — R0602 Shortness of breath: Secondary | ICD-10-CM

## 2015-12-20 ENCOUNTER — Other Ambulatory Visit: Payer: Self-pay

## 2015-12-20 DIAGNOSIS — R0602 Shortness of breath: Secondary | ICD-10-CM

## 2015-12-20 NOTE — Patient Instructions (Signed)
Your physician has requested that you have a lexiscan myoview. For further information please visit [email or web address] Please follow instruction sheet, as given.  [facility] MYOVIEW  Your caregiver has ordered a Stress Test with nuclear imaging. The purpose of this test is to evaluate the blood supply to your heart muscle. This procedure is referred to as a "Non-Invasive Stress Test." This is because other than having an IV started in your vein, nothing is inserted or "invades" your body. Cardiac stress tests are done to find areas of poor blood flow to the heart by determining the extent of coronary artery disease (CAD). Some patients exercise on a treadmill, which naturally increases the blood flow to your heart, while others who are  unable to walk on a treadmill due to physical limitations have a pharmacologic/chemical stress agent called Lexiscan . This medicine will mimic walking on a treadmill by temporarily increasing your coronary blood flow.   Please note: these test may take anywhere between 2-4 hours to complete  PLEASE REPORT TO [facility] MEDICAL MALL ENTRANCE  THE VOLUNTEERS AT THE FIRST DESK WILL DIRECT YOU WHERE TO GO  Date of Procedure:___February 24_________  Arrival Time for Procedure:_  8:45am___________________  Instructions regarding medication:    __xx__:  Hold coreg the night before procedure and morning of procedure   PLEASE NOTIFY THE OFFICE AT LEAST 24 HOURS IN ADVANCE IF YOU ARE UNABLE TO KEEP YOUR APPOINTMENT.  [phone] AND  PLEASE NOTIFY NUCLEAR MEDICINE AT [facility] AT LEAST 24 HOURS IN ADVANCE IF YOU ARE UNABLE TO KEEP YOUR APPOINTMENT. [phone]  How to prepare for your Myoview test:   Do not eat or drink after midnight  No caffeine for 24 hours prior to test  No smoking 24 hours prior to test.  Your medication may be taken with water.  If your doctor stopped a medication because of this test, do not take that medication.  Ladies, please do not wear  dresses.  Skirts or pants are appropriate. Please wear a short sleeve shirt.  No perfume, cologne or lotion.  Wear comfortable walking shoes. No heels!          Cardiac Nuclear Scanning A cardiac nuclear scan is used to check your heart for problems, such as the following:  A portion of the heart is not getting enough blood.  Part of the heart muscle has died, which happens with a heart attack.  The heart wall is not working normally.  In this test, a radioactive dye (tracer) is injected into your bloodstream. After the tracer has traveled to your heart, a scanning device is used to measure how much of the tracer is absorbed by or distributed to various areas of your heart. LET [facility] CARE PROVIDER KNOW ABOUT:  Any allergies you have.  All medicines you are taking, including vitamins, herbs, eye drops, creams, and over-the-counter medicines.  Previous problems you or members of your family have had with the use of anesthetics.  Any blood disorders you have.  Previous surgeries you have had.  Medical conditions you have.  RISKS AND COMPLICATIONS Generally, this is a safe procedure. However, as with any procedure, problems can occur. Possible problems include:   Serious chest pain.  Rapid heartbeat.  Sensation of warmth in your chest. This usually passes quickly. BEFORE THE PROCEDURE Ask your health care provider about changing or stopping your regular medicines. PROCEDURE This procedure is usually done at a hospital and takes 2-4 hours.  An IV tube is inserted into one  of your veins.  Your health care provider will inject a small amount of radioactive tracer through the tube.  You will then wait for 20-40 minutes while the tracer travels through your bloodstream.  You will lie down on an exam table so images of your heart can be taken. Images will be taken for about 15-20 minutes.  You will exercise on a treadmill or stationary bike. While you exercise,  your heart activity will be monitored with an electrocardiogram (ECG), and your blood pressure will be checked.  If you are unable to exercise, you may be given a medicine to make your heart beat faster.  When blood flow to your heart has peaked, tracer will again be injected through the IV tube.  After 20-40 minutes, you will get back on the exam table and have more images taken of your heart.  When the procedure is over, your IV tube will be removed. AFTER THE PROCEDURE  You will likely be able to leave shortly after the test. Unless your health care provider tells you otherwise, you may return to your normal schedule, including diet, activities, and medicines.  Make sure you find out how and when you will get your test results.   This information is not intended to replace advice given to you by your health care provider. Make sure you discuss any questions you have with your health care provider.   Document Released: 11/14/2004 Document Revised: 10/25/2013 Document Reviewed: 09/28/2013 Elsevier Interactive Patient Education [employer]

## 2015-12-27 ENCOUNTER — Telehealth: Payer: Self-pay

## 2015-12-27 NOTE — Telephone Encounter (Signed)
Reviewed myoview instructions w/pt who is agreeable w/plan.

## 2015-12-28 ENCOUNTER — Ambulatory Visit
Admission: RE | Admit: 2015-12-28 | Discharge: 2015-12-28 | Disposition: A | Discharge status: Home / Self Care | Payer: Medicaid Other | Source: Ambulatory Visit | Attending: Cardiovascular Disease | Admitting: Cardiovascular Disease

## 2015-12-28 DIAGNOSIS — Q219 Congenital malformation of cardiac septum, unspecified: Secondary | ICD-10-CM | POA: Diagnosis not present

## 2015-12-28 DIAGNOSIS — R0602 Shortness of breath: Secondary | ICD-10-CM | POA: Diagnosis present

## 2015-12-28 LAB — NM MYOCAR MULTI W/SPECT W/WALL MOTION / EF
CHL CUP NUCLEAR SDS: 0
CHL CUP NUCLEAR SRS: 7
CHL CUP RESTING HR STRESS: 74 {beats}/min
CHL CUP STRESS STAGE 2 GRADE: 0 %
CHL CUP STRESS STAGE 2 SPEED: 0 mph
CHL CUP STRESS STAGE 3 HR: 76 {beats}/min
CHL CUP STRESS STAGE 3 SPEED: 0 mph
CHL CUP STRESS STAGE 4 HR: 95 {beats}/min
CHL CUP STRESS STAGE 5 GRADE: 0 %
CHL CUP STRESS STAGE 5 SPEED: 0 mph
CHL CUP STRESS STAGE 6 GRADE: 0 %
CHL CUP STRESS STAGE 6 HR: 92 {beats}/min
CHL CUP STRESS STAGE 6 SPEED: 0 mph
CSEPHR: 61 %
CSEPPHR: 95 {beats}/min
Estimated workload: 1 METS
Exercise duration (min): 0 min
Exercise duration (sec): 0 s
LV dias vol: 83 mL
LV sys vol: 27 mL
MPHR: 160 {beats}/min
Percent of predicted max HR: 59 %
SSS: 2
Stage 1 Grade: 0 %
Stage 1 HR: 74 {beats}/min
Stage 1 Speed: 0 mph
Stage 2 HR: 76 {beats}/min
Stage 3 Grade: 0 %
Stage 4 Grade: 0 %
Stage 4 Speed: 0 mph
Stage 5 HR: 97 {beats}/min
Stage 6 DBP: 67 mmHg
Stage 6 SBP: 128 mmHg
TID: 1.04

## 2015-12-28 MED ORDER — TECHNETIUM TC 99M SESTAMIBI - CARDIOLITE
30.0000 | Freq: Once | INTRAVENOUS | Status: AC | PRN
  Administered 2015-12-28: 10:00:00 32.54 via INTRAVENOUS

## 2015-12-28 MED ORDER — TECHNETIUM TC 99M SESTAMIBI - CARDIOLITE
14.0590 | Freq: Once | INTRAVENOUS | Status: AC | PRN
  Administered 2015-12-28: 09:00:00 14.059 via INTRAVENOUS

## 2015-12-28 MED ORDER — REGADENOSON 0.4 MG/5ML IV SOLN
0.4000 mg | Freq: Once | INTRAVENOUS | Status: AC
  Administered 2015-12-28: 0.4 mg via INTRAVENOUS

## 2016-01-28 ENCOUNTER — Ambulatory Visit (INDEPENDENT_AMBULATORY_CARE_PROVIDER_SITE_OTHER): Payer: Medicaid Other | Admitting: Family Medicine

## 2016-01-28 ENCOUNTER — Encounter: Payer: Self-pay | Admitting: Family Medicine

## 2016-01-28 VITALS — BP 136/73 | HR 87 | Temp 98.3°F | Resp 16 | Ht 66.0 in | Wt 219.6 lb

## 2016-01-28 DIAGNOSIS — F411 Generalized anxiety disorder: Secondary | ICD-10-CM

## 2016-01-28 MED ORDER — CITALOPRAM HYDROBROMIDE 10 MG PO TABS: 10.0000 mg | ORAL_TABLET | Freq: Every day | ORAL | Status: DC

## 2016-01-28 MED ORDER — ALPRAZOLAM 0.5 MG PO TABS: 0.5000 mg | ORAL_TABLET | Freq: Three times a day (TID) | ORAL | Status: DC | PRN

## 2016-01-28 NOTE — Progress Notes (Signed)
Name: James [name]   MRN: [id number]    DOB: [birth year]   Date:01/28/2016       Progress Note  Subjective  Chief Complaint  Chief Complaint  Patient presents with  . Follow-up    6 WK  . Hypertension  . Shortness of Breath  . Medication Refill    xanax     HPI  Anxiety: Feels better on Alprazolam 0.5 mg three times daily as needed. Initial symptoms included being 'stressed out', was constantly worried, insomnia. Anxiety is better but still 'doesn't feel quite right'. He feels jittery, especially in the morning, light-headed and shortness of breath.   Past Medical History  Diagnosis Date  . Hypertension 2007  . Family history of colonic polyps   . Special screening for malignant neoplasms, colon   . Depression     history of   . History of palpitations     intermittent; patient states heart skips a beat at times  . Hyperlipidemia   . 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 Shoulder injury 1976  . Anxiety     history of  . Hand pain, right   . Spinal stenosis of lumbar region   . Insomnia     Past Surgical History  Procedure Laterality Date  . Colonoscopy  2013  . Tonsillectomy  1964  . Knee surgery Left 1979  . Polypectomy  2013  . Cardiac catheterization  2007    Family History  Problem Relation Age of Onset  . Colon cancer Father   . Cancer Father     colon  . Transient ischemic attack Father     history of  . Arthritis Mother   . Heart failure Mother   . Hypertension Mother   . Kidney failure Mother     Social History   Social History  . Marital Status: Single    Spouse Name: N/A  . Number of Children: N/A  . Years of Education: N/A   Occupational History  . Not on file.   Social History Main Topics  . Smoking status: Former Smoker -- 1.00 packs/day for 25 years  . Smokeless tobacco: Never Used  . Alcohol Use: No  . Drug Use: No  . Sexual Activity: No   Other Topics Concern  . Not on  file   Social History Narrative     Current outpatient prescriptions:  .  ALPRAZolam (XANAX) 0.5 MG tablet, Take 1 tablet (0.5 mg total) by mouth 3 (three) times daily as needed for anxiety., Disp: 90 tablet, Rfl: 0 .  carvedilol (COREG) 6.25 MG tablet, Take 1 tablet (6.25 mg total) by mouth 2 (two) times daily., Disp: 60 tablet, Rfl: 3 .  meloxicam (MOBIC) 15 MG tablet, Take 15 mg by mouth daily., Disp: , Rfl:  .  methocarbamol (ROBAXIN) 500 MG tablet, Take 500 mg by mouth 2 (two) times daily., Disp: , Rfl:   Allergies  Allergen Reactions  . Naprosyn [Naproxen] Itching     Review of Systems  Psychiatric/Behavioral: The patient is nervous/anxious.     Objective  Filed Vitals:   01/28/16 1516  BP: 136/73  Pulse: 87  Temp: 98.3 F (36.8 C)  TempSrc: Oral  Resp: 16  Height:  (1.676 m)  Weight: 219 lb 9.6 oz (99.61 kg)  SpO2: 93%    Physical Exam  Constitutional: He is oriented to person, place, and time and well-developed, well-nourished,  and in no distress.  Cardiovascular: Normal rate and regular rhythm.   Pulmonary/Chest: Effort normal and breath sounds normal.  Neurological: He is alert and oriented to person, place, and time.  Psychiatric: Mood, memory, affect and judgment normal.  Nursing note and vitals reviewed.         Assessment & Plan  1. Generalized anxiety disorder Continue on alprazolam on account of good symptom control, we will add SSRI for optimal relief of anxiety. Recheck in 3 months. - ALPRAZolam (XANAX) 0.5 MG tablet; Take 1 tablet (0.5 mg total) by mouth 3 (three) times daily as needed for anxiety.  Dispense: 90 tablet; Refill: 0 - citalopram (CELEXA) 10 MG tablet; Take 1 tablet (10 mg total) by mouth daily.  Dispense: 30 tablet; Refill: 2   Kasi Lasky [name] [name] 01/28/2016 3:53 PM

## 2016-02-05 ENCOUNTER — Ambulatory Visit (INDEPENDENT_AMBULATORY_CARE_PROVIDER_SITE_OTHER): Payer: Medicaid Other | Admitting: Cardiovascular Disease

## 2016-02-05 ENCOUNTER — Encounter: Payer: Self-pay | Admitting: Cardiovascular Disease

## 2016-02-05 VITALS — BP 130/80 | HR 81 | Ht 66.0 in | Wt 218.8 lb

## 2016-02-05 DIAGNOSIS — I447 Left bundle-branch block, unspecified: Secondary | ICD-10-CM | POA: Diagnosis not present

## 2016-02-05 NOTE — Progress Notes (Signed)
Cardiology Office Note   Date:  02/05/2016   ID:  James [name], DOB 06/03/1956, MRN [id number]  PCP:  Brayton ElSyed Shah, MD  Cardiologist:   Lorine BearsMuhammad Arida, MD   Chief Complaint  Patient presents with  . Shortness of Breath    with activity      History of Present Illness: James [name] is a 60 y.o. male who presents for A follow-up visit regarding left bundle branch block and exertional dyspnea.  Reported cardiac catheterization in 2007 showed no obstructive disease.  He has known history of hypertension , hyperlipidemia and previous tobacco use.  He was evaluated for exertional dyspnea recently. Echocardiogram showed low normal LV systolic function with an ejection fraction of 50-55%. He underwent a pharmacologic nuclear stress test which showed no evidence of ischemia with normal ejection fraction. He was started on carvedilol for blood pressure control. Blood pressure has improved significantly since then. He reports feeling better than before with less dizziness.  Past Medical History  Diagnosis Date  . Hypertension 2007  . Family history of colonic polyps   . Special screening for malignant neoplasms, colon   . Depression     history of   . History of palpitations     intermittent; patient states heart skips a beat at times  . Hyperlipidemia   . 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 Shoulder injury 1976  . Anxiety     history of  . Hand pain, right   . Spinal stenosis of lumbar region   . Insomnia     Past Surgical History  Procedure Laterality Date  . Colonoscopy  2013  . Tonsillectomy  1964  . Knee surgery Left 1979  . Polypectomy  2013  . Cardiac catheterization  2007     Current Outpatient Prescriptions  Medication Sig Dispense Refill  . ALPRAZolam (XANAX) 0.5 MG tablet Take 1 tablet (0.5 mg total) by mouth 3 (three) times daily as needed for anxiety. 90 tablet 0  . carvedilol (COREG) 6.25 MG tablet Take 1  tablet (6.25 mg total) by mouth 2 (two) times daily. 60 tablet 3  . citalopram (CELEXA) 10 MG tablet Take 1 tablet (10 mg total) by mouth daily. 30 tablet 2  . meloxicam (MOBIC) 15 MG tablet Take 15 mg by mouth daily.    . methocarbamol (ROBAXIN) 500 MG tablet Take 500 mg by mouth 2 (two) times daily.     No current facility-administered medications for this visit.    Allergies:   Naprosyn    Social History:  The patient  reports that he has quit smoking. He has never used smokeless tobacco. He reports that he does not drink alcohol or use illicit drugs.   Family History:  The patient's family history includes Arthritis in his mother; Cancer in his father; Colon cancer in his father; Heart failure in his mother; Hypertension in his mother; Kidney failure in his mother; Transient ischemic attack in his father.    ROS:  Please see the history of present illness.   Otherwise, review of systems are positive for none.   All other systems are reviewed and negative.    PHYSICAL EXAM: VS:  BP 130/80 mmHg  Pulse 81  Ht 5\' 6"  (1.676 m)  Wt 218 lb 12.8 oz (99.247 kg)  BMI 35.33 kg/m2 , BMI Body mass index is 35.33 kg/(m^2). GEN: Well nourished, well developed, in no  acute distress HEENT: normal Neck: no JVD, carotid bruits, or masses Cardiac: RRR; no murmurs, rubs, or gallops,no edema  Respiratory:  clear to auscultation bilaterally, normal work of breathing GI: soft, nontender, nondistended, + BS MS: no deformity or atrophy Skin: warm and dry, no rash Neuro:  Strength and sensation are intact Psych: euthymic mood, full affect   EKG:  EKG is not ordered today.    Recent Labs: 10/10/2015: ALT 20; BUN 20; Creatinine, Ser 1.07; Platelets 257; Potassium 4.7; Sodium 141    Lipid Panel No results found for: CHOL, TRIG, HDL, CHOLHDL, VLDL, LDLCALC, LDLDIRECT    Wt Readings from Last 3 Encounters:  02/05/16 218 lb 12.8 oz (99.247 kg)  01/28/16 219 lb 9.6 oz (99.61 kg)  12/17/15 219 lb  6.4 oz (99.519 kg)        ASSESSMENT AND PLAN:  1.  Exertional dyspnea: Negative recent cardiac workup. Symptoms are overall mild. No further cardiac workup is recommended at the present time.  2. Left bundle branch block: Ejection fraction was in the lower normal range. Continue to monitor clinically.  3. Essential hypertension: Blood pressure significantly improved after the addition of carvedilol.     Disposition:   FU with me in 1 year  Signed,  Lorine Bears, MD  02/05/2016 3:59 PM    [facility]

## 2016-02-05 NOTE — Patient Instructions (Signed)
Medication Instructions: Continue same medications.   Labwork: None.   Procedures/Testing: None.   Follow-Up: 1 year follow up with Dr. Jaisen Wiltrout.   Any Additional Special Instructions Will Be Listed Below (If Applicable).     If you need a refill on your cardiac medications before your next appointment, please call your pharmacy.   

## 2016-03-11 ENCOUNTER — Ambulatory Visit (INDEPENDENT_AMBULATORY_CARE_PROVIDER_SITE_OTHER): Payer: Medicaid Other | Admitting: Family Medicine

## 2016-03-11 ENCOUNTER — Encounter: Payer: Self-pay | Admitting: Family Medicine

## 2016-03-11 ENCOUNTER — Ambulatory Visit: Payer: Medicaid Other | Admitting: Family Medicine

## 2016-03-11 VITALS — BP 128/84 | HR 83 | Temp 99.0°F | Resp 18 | Ht 66.0 in | Wt 223.5 lb

## 2016-03-11 DIAGNOSIS — F411 Generalized anxiety disorder: Secondary | ICD-10-CM | POA: Diagnosis not present

## 2016-03-11 MED ORDER — ALPRAZOLAM 0.5 MG PO TABS: 0.5000 mg | ORAL_TABLET | Freq: Three times a day (TID) | ORAL | Status: DC | PRN

## 2016-03-11 NOTE — Progress Notes (Signed)
Name: James [name]   MRN: [id number]    DOB: [birth year]   Date:03/11/2016       Progress Note  Subjective  Chief Complaint  Chief Complaint  Patient presents with  . Follow-up    6 wk    HPI  Anxiety: Feels less stressed on Alprazolam 0.5 mg taken three times daily as needed. Initial symptoms were feeling worried, nervous, shortness of breath. Symptoms improved with addition of Alprazolam. Citalopram was added 6 weeks ago, forgot about it until a few days ago and now has been taking it regularly.   Past Medical History  Diagnosis Date  . Hypertension 2007  . Family history of colonic polyps   . Special screening for malignant neoplasms, colon   . Depression     history of   . History of palpitations     intermittent; patient states heart skips a beat at times  . Hyperlipidemia   . 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 Shoulder injury 1976  . Anxiety     history of  . Hand pain, right   . Spinal stenosis of lumbar region   . Insomnia     Past Surgical History  Procedure Laterality Date  . Colonoscopy  2013  . Tonsillectomy  1964  . Knee surgery Left 1979  . Polypectomy  2013  . Cardiac catheterization  2007    Family History  Problem Relation Age of Onset  . Colon cancer Father   . Cancer Father     colon  . Transient ischemic attack Father     history of  . Arthritis Mother   . Heart failure Mother   . Hypertension Mother   . Kidney failure Mother     Social History   Social History  . Marital Status: Single    Spouse Name: N/A  . Number of Children: N/A  . Years of Education: N/A   Occupational History  . Not on file.   Social History Main Topics  . Smoking status: Former Smoker -- 1.00 packs/day for 25 years  . Smokeless tobacco: Never Used  . Alcohol Use: No  . Drug Use: No  . Sexual Activity: No   Other Topics Concern  . Not on file   Social History Narrative     Current  outpatient prescriptions:  .  ALPRAZolam (XANAX) 0.5 MG tablet, Take 1 tablet (0.5 mg total) by mouth 3 (three) times daily as needed for anxiety., Disp: 90 tablet, Rfl: 0 .  carvedilol (COREG) 6.25 MG tablet, Take 1 tablet (6.25 mg total) by mouth 2 (two) times daily., Disp: 60 tablet, Rfl: 3 .  citalopram (CELEXA) 10 MG tablet, Take 1 tablet (10 mg total) by mouth daily., Disp: 30 tablet, Rfl: 2 .  meloxicam (MOBIC) 15 MG tablet, Take 15 mg by mouth daily., Disp: , Rfl:  .  methocarbamol (ROBAXIN) 500 MG tablet, Take 500 mg by mouth 2 (two) times daily., Disp: , Rfl:   Allergies  Allergen Reactions  . Naprosyn [Naproxen] Itching     Review of Systems  Psychiatric/Behavioral: Negative for depression. The patient is nervous/anxious.      Objective  Filed Vitals:   03/11/16 1446  BP: 128/84  Pulse: 83  Temp: 99 F (37.2 C)  TempSrc: Oral  Resp: 18  Height:  (1.676 m)  Weight: 223 lb 8 oz (101.379 kg)  SpO2: 98%  Physical Exam  Constitutional: He is oriented to person, place, and time and well-developed, well-nourished, and in no distress.  Cardiovascular: Normal rate and regular rhythm.   Pulmonary/Chest: Effort normal and breath sounds normal.  Neurological: He is alert and oriented to person, place, and time.  Psychiatric: Mood, memory, affect and judgment normal.  Nursing note and vitals reviewed.      Assessment & Plan  1. Generalized anxiety disorder Stable and improved on alprazolam taken up to 3 times daily as needed, refills provided. Follow-up in 1 month. - ALPRAZolam (XANAX) 0.5 MG tablet; Take 1 tablet (0.5 mg total) by mouth 3 (three) times daily as needed for anxiety.  Dispense: 90 tablet; Refill: 0   Angeliah Wisdom [name] [name] 03/11/2016 2:52 PM

## 2016-04-11 ENCOUNTER — Other Ambulatory Visit: Payer: Self-pay

## 2016-04-11 MED ORDER — CARVEDILOL 6.25 MG PO TABS: 6.2500 mg | ORAL_TABLET | Freq: Two times a day (BID) | ORAL | Status: DC

## 2016-04-11 NOTE — Telephone Encounter (Signed)
Refill sent for Carvedilol 6.25 mg  

## 2016-04-22 ENCOUNTER — Encounter: Payer: Self-pay | Admitting: Family Medicine

## 2016-04-22 ENCOUNTER — Ambulatory Visit (INDEPENDENT_AMBULATORY_CARE_PROVIDER_SITE_OTHER): Payer: Medicaid Other | Admitting: Family Medicine

## 2016-04-22 VITALS — BP 124/81 | HR 88 | Temp 98.5°F | Resp 17 | Ht 66.0 in | Wt 219.7 lb

## 2016-04-22 DIAGNOSIS — F411 Generalized anxiety disorder: Secondary | ICD-10-CM

## 2016-04-22 DIAGNOSIS — R739 Hyperglycemia, unspecified: Secondary | ICD-10-CM | POA: Diagnosis not present

## 2016-04-22 DIAGNOSIS — Z8639 Personal history of other endocrine, nutritional and metabolic disease: Secondary | ICD-10-CM

## 2016-04-22 DIAGNOSIS — Z0000 Encounter for general adult medical examination without abnormal findings: Secondary | ICD-10-CM

## 2016-04-22 LAB — GLUCOSE, POCT (MANUAL RESULT ENTRY): POC GLUCOSE: 85 mg/dL (ref 70–99)

## 2016-04-22 LAB — POCT GLYCOSYLATED HEMOGLOBIN (HGB A1C): HEMOGLOBIN A1C: 5.6

## 2016-04-22 LAB — TSH: TSH: 1.07 mIU/L (ref 0.40–4.50)

## 2016-04-22 MED ORDER — ALPRAZOLAM 0.5 MG PO TABS: 0.5000 mg | ORAL_TABLET | Freq: Three times a day (TID) | ORAL | Status: DC | PRN

## 2016-04-22 NOTE — Progress Notes (Signed)
Name: James [name]   MRN: [id number]    DOB: December 05, 1955   Date:04/22/2016       Progress Note  Subjective  Chief Complaint  Chief Complaint  Patient presents with  . Annual Exam    CPE    Anxiety Presents for follow-up visit. The problem has been gradually improving. Symptoms include excessive worry, nervous/anxious behavior and shortness of breath (intermittent periods of shortness of breath, some associated with anxiety.). Patient reports no nausea. The severity of symptoms is moderate and causing significant distress.   His past medical history is significant for anxiety/panic attacks. Past treatments include benzodiazephines and SSRIs. The treatment provided significant relief. Compliance with prior treatments has been good.    Pt. Is hre for a Complete Physical Exam. Last Colonoscopy was in 2012, does not know when he is supposed to return to have another one. Does not remember when he had DRE.   Past Medical History  Diagnosis Date  . Hypertension 2007  . Family history of colonic polyps   . Special screening for malignant neoplasms, colon   . Depression     history of   . History of palpitations     intermittent; patient states heart skips a beat at times  . Hyperlipidemia   . 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 Shoulder injury 1976  . Anxiety     history of  . Hand pain, right   . Spinal stenosis of lumbar region   . Insomnia     Past Surgical History  Procedure Laterality Date  . Colonoscopy  2013  . Tonsillectomy  1964  . Knee surgery Left 1979  . Polypectomy  2013  . Cardiac catheterization  2007    Family History  Problem Relation Age of Onset  . Colon cancer Father   . Cancer Father     colon  . Transient ischemic attack Father     history of  . Arthritis Mother   . Heart failure Mother   . Hypertension Mother   . Kidney failure Mother     Social History   Social History  . Marital  Status: Single    Spouse Name: N/A  . Number of Children: N/A  . Years of Education: N/A   Occupational History  . Not on file.   Social History Main Topics  . Smoking status: Former Smoker -- 1.00 packs/day for 25 years  . Smokeless tobacco: Never Used  . Alcohol Use: No  . Drug Use: No  . Sexual Activity: No   Other Topics Concern  . Not on file   Social History Narrative     Current outpatient prescriptions:  .  ALPRAZolam (XANAX) 0.5 MG tablet, Take 1 tablet (0.5 mg total) by mouth 3 (three) times daily as needed for anxiety., Disp: 90 tablet, Rfl: 0 .  carvedilol (COREG) 6.25 MG tablet, Take 1 tablet (6.25 mg total) by mouth 2 (two) times daily., Disp: 60 tablet, Rfl: 3 .  citalopram (CELEXA) 10 MG tablet, Take 1 tablet (10 mg total) by mouth daily., Disp: 30 tablet, Rfl: 2 .  meloxicam (MOBIC) 15 MG tablet, Take 15 mg by mouth daily., Disp: , Rfl:  .  methocarbamol (ROBAXIN) 500 MG tablet, Take 500 mg by mouth 2 (two) times daily., Disp: , Rfl:   Allergies  Allergen Reactions  . Naprosyn [Naproxen] Itching     Review of Systems  Constitutional:  Negative for fever, chills, weight loss and malaise/fatigue.  HENT: Negative for sore throat.   Eyes: Negative for blurred vision and double vision.  Respiratory: Positive for shortness of breath (intermittent periods of shortness of breath, some associated with anxiety.). Negative for cough.   Gastrointestinal: Negative for heartburn, nausea, vomiting, abdominal pain, diarrhea, constipation, blood in stool and melena.  Genitourinary: Negative for dysuria, frequency and hematuria.  Musculoskeletal: Positive for back pain and joint pain.  Skin: Negative for itching and rash.  Neurological: Negative for headaches.  Psychiatric/Behavioral: Positive for depression. The patient is nervous/anxious.     Objective  Filed Vitals:   04/22/16 1356  BP: 124/81  Pulse: 88  Temp: 98.5 F (36.9 C)  TempSrc: Oral  Resp: 17   Height: 5\' 6"  (1.676 m)  Weight: 219 lb 11.2 oz (99.655 kg)  SpO2: 96%    Physical Exam  Constitutional: He is oriented to person, place, and time and well-developed, well-nourished, and in no distress.  HENT:  Head: Normocephalic and atraumatic.  Right Ear: External ear normal.  Left Ear: External ear normal.  Mouth/Throat: No posterior oropharyngeal erythema.  Eyes: Pupils are equal, round, and reactive to light.  Cardiovascular: Normal rate, regular rhythm, S1 normal, S2 normal and normal heart sounds.   No murmur heard. Pulmonary/Chest: Effort normal and breath sounds normal. No respiratory distress. He has no wheezes. He has no rhonchi.  Abdominal: Soft. Bowel sounds are normal. There is no tenderness.  Genitourinary: Prostate normal. Rectal exam shows no external hemorrhoid and no mass.  Musculoskeletal:       Right ankle: He exhibits swelling (right leg 2+ pitting edema).       Left ankle: He exhibits swelling (Left leg 1+ pitting edema.).  Neurological: He is alert and oriented to person, place, and time.  Skin: Skin is warm, dry and intact.  Psychiatric: Memory, affect and judgment normal. His mood appears anxious.  Nursing note and vitals reviewed.     Assessment & Plan  1. Annual physical exam Obtain age appropriate screening evaluation. - Lipid Profile - PSA - TSH - Comprehensive Metabolic Panel (CMET)  2. Generalized anxiety disorder Improving, refills on alprazolam provided - ALPRAZolam (XANAX) 0.5 MG tablet; Take 1 tablet (0.5 mg total) by mouth 3 (three) times daily as needed for anxiety.  Dispense: 90 tablet; Refill: 0  3. Hx of non anemic vitamin B12 deficiency James [name] has history of vitamin B12 deficiency in the past, reportedly has received B12 injections. Obtain levels today - B12  4. Hyperglycemia  - POCT HgB A1C - POCT Glucose (CBG)   James [name] James [name] [name] 04/22/2016 2:52 PM

## 2016-04-23 LAB — COMPREHENSIVE METABOLIC PANEL
ALT: 14 U/L (ref 9–46)
AST: 15 U/L (ref 10–35)
Albumin: 4.5 g/dL (ref 3.6–5.1)
Alkaline Phosphatase: 80 U/L (ref 40–115)
BUN: 17 mg/dL (ref 7–25)
CALCIUM: 9.3 mg/dL (ref 8.6–10.3)
CHLORIDE: 104 mmol/L (ref 98–110)
CO2: 23 mmol/L (ref 20–31)
Creat: 1.15 mg/dL (ref 0.70–1.25)
GLUCOSE: 78 mg/dL (ref 65–99)
POTASSIUM: 5.1 mmol/L (ref 3.5–5.3)
Sodium: 138 mmol/L (ref 135–146)
Total Bilirubin: 0.5 mg/dL (ref 0.2–1.2)
Total Protein: 7.5 g/dL (ref 6.1–8.1)

## 2016-04-23 LAB — LIPID PANEL
CHOL/HDL RATIO: 6.3 ratio — AB (ref <=5.0)
Cholesterol: 246 mg/dL — ABNORMAL HIGH (ref 125–200)
HDL: 39 mg/dL — AB (ref >=40)
LDL CALC: 149 mg/dL — AB (ref <130)
TRIGLYCERIDES: 290 mg/dL — AB (ref <150)
VLDL: 58 mg/dL — AB (ref <30)

## 2016-04-23 LAB — PSA: PSA: 0.92 ng/mL (ref <=4.00)

## 2016-05-27 ENCOUNTER — Ambulatory Visit (INDEPENDENT_AMBULATORY_CARE_PROVIDER_SITE_OTHER): Payer: Medicaid Other | Admitting: Family Medicine

## 2016-05-27 ENCOUNTER — Encounter: Payer: Self-pay | Admitting: Family Medicine

## 2016-05-27 VITALS — BP 128/70 | HR 82 | Temp 98.2°F | Resp 18 | Ht 66.0 in | Wt 218.0 lb

## 2016-05-27 DIAGNOSIS — Z23 Encounter for immunization: Secondary | ICD-10-CM | POA: Diagnosis not present

## 2016-05-27 DIAGNOSIS — F411 Generalized anxiety disorder: Secondary | ICD-10-CM | POA: Diagnosis not present

## 2016-05-27 MED ORDER — ALPRAZOLAM 0.5 MG PO TABS: 0.5000 mg | ORAL_TABLET | Freq: Three times a day (TID) | ORAL | 2 refills | Status: DC | PRN

## 2016-05-27 NOTE — Progress Notes (Signed)
Name: James [name]   MRN: [id number]    DOB: 11/19/1955   Date:05/27/2016       Progress Note  Subjective  Chief Complaint  Chief Complaint  Patient presents with  . Anxiety    medication refills    Anxiety  Presents for follow-up visit. Symptoms include excessive worry, irritability, malaise and nervous/anxious behavior. Patient reports no depressed mood, muscle tension or panic. The severity of symptoms is moderate.      Past Medical History:  Diagnosis Date  . Anxiety    history of  . Depression    history of   . Eyes sensitive to light    due to side effect of manufactured version of Gabapentin   . Family history of colonic polyps   . Hand pain, right   . History of palpitations    intermittent; patient states heart skips a beat at times  . Hyperlipidemia   . Hypertension 2007  . Insomnia   . Shoulder injury 1976  . Special screening for malignant neoplasms, colon   . Spinal stenosis of lumbar region     Past Surgical History:  Procedure Laterality Date  . CARDIAC CATHETERIZATION  2007  . COLONOSCOPY  2013  . KNEE SURGERY Left 1979  . POLYPECTOMY  2013  . TONSILLECTOMY  1964    Family History  Problem Relation Age of Onset  . Colon cancer Father   . Cancer Father     colon  . Transient ischemic attack Father     history of  . Arthritis Mother   . Heart failure Mother   . Hypertension Mother   . Kidney failure Mother     Social History   Social History  . Marital status: Single    Spouse name: N/A  . Number of children: N/A  . Years of education: N/A   Occupational History  . Not on file.   Social History Main Topics  . Smoking status: Former Smoker    Packs/day: 1.00    Years: 25.00  . Smokeless tobacco: Never Used  . Alcohol use No  . Drug use: No  . Sexual activity: No   Other Topics Concern  . Not on file   Social History Narrative  . No narrative on file     Current Outpatient Prescriptions:  .  ALPRAZolam (XANAX) 0.5 MG  tablet, Take 1 tablet (0.5 mg total) by mouth 3 (three) times daily as needed for anxiety., Disp: 90 tablet, Rfl: 0 .  carvedilol (COREG) 6.25 MG tablet, Take 1 tablet (6.25 mg total) by mouth 2 (two) times daily., Disp: 60 tablet, Rfl: 3 .  citalopram (CELEXA) 10 MG tablet, Take 1 tablet (10 mg total) by mouth daily., Disp: 30 tablet, Rfl: 2 .  meloxicam (MOBIC) 15 MG tablet, Take 15 mg by mouth daily., Disp: , Rfl:  .  methocarbamol (ROBAXIN) 500 MG tablet, Take 500 mg by mouth 2 (two) times daily., Disp: , Rfl:   Allergies  Allergen Reactions  . Naprosyn [Naproxen] Itching     Review of Systems  Constitutional: Positive for irritability.  Psychiatric/Behavioral: The patient is nervous/anxious.     Objective  Vitals:   05/27/16 1520  BP: 128/70  Pulse: 82  Resp: 18  Temp: 98.2 F (36.8 C)  TempSrc: Oral  SpO2: 94%  Weight: 218 lb (98.9 kg)  Height:  (1.676 m)    Physical Exam  Constitutional: He is oriented to person, place, and time and well-developed, well-nourished,  and in no distress.  HENT:  Head: Normocephalic and atraumatic.  Cardiovascular: Normal rate, regular rhythm and normal heart sounds.   No murmur heard. Pulmonary/Chest: Effort normal and breath sounds normal. He has no wheezes.  Neurological: He is alert and oriented to person, place, and time.  Psychiatric: Mood, memory, affect and judgment normal.  Nursing note and vitals reviewed.     Assessment & Plan  1. Generalized anxiety disorder Improving, continue present therapy. - ALPRAZolam (XANAX) 0.5 MG tablet; Take 1 tablet (0.5 mg total) by mouth 3 (three) times daily as needed for anxiety.  Dispense: 90 tablet; Refill: 2   Alessandra Sawdey Asad [facility] 05/27/2016 3:30 PM

## 2016-08-14 ENCOUNTER — Other Ambulatory Visit: Payer: Self-pay | Admitting: *Deleted

## 2016-08-14 MED ORDER — CARVEDILOL 6.25 MG PO TABS: 6.2500 mg | ORAL_TABLET | Freq: Two times a day (BID) | ORAL | 3 refills | Status: DC

## 2016-08-26 ENCOUNTER — Encounter: Payer: Self-pay | Admitting: Family Medicine

## 2016-08-26 ENCOUNTER — Ambulatory Visit (INDEPENDENT_AMBULATORY_CARE_PROVIDER_SITE_OTHER): Payer: Medicaid Other | Admitting: Family Medicine

## 2016-08-26 ENCOUNTER — Ambulatory Visit: Payer: Medicaid Other | Admitting: Family Medicine

## 2016-08-26 VITALS — BP 136/82 | HR 84 | Temp 98.2°F | Resp 16 | Ht 66.0 in | Wt 218.7 lb

## 2016-08-26 DIAGNOSIS — R0982 Postnasal drip: Secondary | ICD-10-CM | POA: Insufficient documentation

## 2016-08-26 DIAGNOSIS — R062 Wheezing: Secondary | ICD-10-CM | POA: Diagnosis not present

## 2016-08-26 DIAGNOSIS — E538 Deficiency of other specified B group vitamins: Secondary | ICD-10-CM | POA: Diagnosis not present

## 2016-08-26 DIAGNOSIS — F411 Generalized anxiety disorder: Secondary | ICD-10-CM | POA: Diagnosis not present

## 2016-08-26 LAB — VITAMIN B12: VITAMIN B 12: 485 pg/mL (ref 200–1100)

## 2016-08-26 MED ORDER — FLUTICASONE PROPIONATE 50 MCG/ACT NA SUSP: 2.0000 | Freq: Every day | NASAL | 0 refills | Status: DC

## 2016-08-26 MED ORDER — ALPRAZOLAM 0.5 MG PO TABS: 0.5000 mg | ORAL_TABLET | Freq: Three times a day (TID) | ORAL | 2 refills | Status: DC | PRN

## 2016-08-26 MED ORDER — AZITHROMYCIN 250 MG PO TABS: ORAL_TABLET | ORAL | 0 refills | Status: DC

## 2016-08-26 MED ORDER — ALBUTEROL SULFATE HFA 108 (90 BASE) MCG/ACT IN AERS: 2.0000 | INHALATION_SPRAY | Freq: Three times a day (TID) | RESPIRATORY_TRACT | 0 refills | Status: DC | PRN

## 2016-08-26 NOTE — Progress Notes (Signed)
Name: James [name]   MRN: [id number]    DOB: [birth year]   Date:08/26/2016       Progress Note  Subjective  Chief Complaint  Chief Complaint  Patient presents with  . Follow-up    medication refills  . Wheezing    Wheezing   This is a new problem. The current episode started 1 to 4 weeks ago (2-3 weeks). Associated symptoms include coughing and sputum production. Pertinent negatives include no chest pain, chills or sore throat (feels the need to constantly clear his throat.). He has tried nothing for the symptoms. There is no history of asthma (childhood asthma, resolved), chronic lung disease, heart failure or pneumonia.    Past Medical History:  Diagnosis Date  . Anxiety    history of  . Depression    history of   . Eyes sensitive to light    due to side effect of manufactured version of Gabapentin   . Family history of colonic polyps   . Hand pain, right   . History of palpitations    intermittent; patient states heart skips a beat at times  . Hyperlipidemia   . Hypertension 2007  . Insomnia   . Shoulder injury 1976  . Special screening for malignant neoplasms, colon   . Spinal stenosis of lumbar region     Past Surgical History:  Procedure Laterality Date  . CARDIAC CATHETERIZATION  2007  . COLONOSCOPY  2013  . KNEE SURGERY Left 1979  . POLYPECTOMY  2013  . TONSILLECTOMY  1964    Family History  Problem Relation Age of Onset  . Colon cancer Father   . Cancer Father     colon  . Transient ischemic attack Father     history of  . Arthritis Mother   . Heart failure Mother   . Hypertension Mother   . Kidney failure Mother     Social History   Social History  . Marital status: Single    Spouse name: N/A  . Number of children: N/A  . Years of education: N/A   Occupational History  . Not on file.   Social History Main Topics  . Smoking status: Former Smoker    Packs/day: 1.00    Years: 25.00  . Smokeless tobacco: Never Used  . Alcohol use No  .  Drug use: No  . Sexual activity: No   Other Topics Concern  . Not on file   Social History Narrative  . No narrative on file     Current Outpatient Prescriptions:  .  ALPRAZolam (XANAX) 0.5 MG tablet, Take 1 tablet (0.5 mg total) by mouth 3 (three) times daily as needed for anxiety., Disp: 90 tablet, Rfl: 2 .  carvedilol (COREG) 6.25 MG tablet, Take 1 tablet (6.25 mg total) by mouth 2 (two) times daily., Disp: 60 tablet, Rfl: 3 .  citalopram (CELEXA) 10 MG tablet, Take 1 tablet (10 mg total) by mouth daily., Disp: 30 tablet, Rfl: 2 .  meloxicam (MOBIC) 15 MG tablet, Take 15 mg by mouth daily., Disp: , Rfl:  .  methocarbamol (ROBAXIN) 500 MG tablet, Take 500 mg by mouth 2 (two) times daily., Disp: , Rfl:   Allergies  Allergen Reactions  . Naprosyn [Naproxen] Itching     Review of Systems  Constitutional: Negative for chills.  HENT: Negative for sore throat (feels the need to constantly clear his throat.).   Respiratory: Positive for cough, sputum production and wheezing.   Cardiovascular: Negative for chest  pain.    Objective  Vitals:   08/26/16 1417  BP: 136/82  Pulse: 84  Resp: 16  Temp: 98.2 F (36.8 C)  TempSrc: Oral  SpO2: 96%  Weight: 218 lb 11.2 oz (99.2 kg)  Height: 5\' 6"  (1.676 m)    Physical Exam  Constitutional: He is oriented to person, place, and time and well-developed, well-nourished, and in no distress.  HENT:  Right Ear: Tympanic membrane and ear canal normal. No drainage, swelling or tenderness.  Nose: Right sinus exhibits no maxillary sinus tenderness and no frontal sinus tenderness. Left sinus exhibits no maxillary sinus tenderness and no frontal sinus tenderness.  Mouth/Throat: Posterior oropharyngeal erythema present.  Left Ear with cerumen impaction, TM not visualized. Nasal turbinates enlarged and mildly inflamed  Cardiovascular: Normal rate, regular rhythm, S1 normal, S2 normal and normal heart sounds.   No murmur heard. Pulmonary/Chest:  Breath sounds normal. He has no decreased breath sounds. He has no wheezes. He has no rhonchi.  Neurological: He is alert and oriented to person, place, and time.  Psychiatric: Mood, memory, affect and judgment normal.  Nursing note and vitals reviewed.    Assessment & Plan  1. Post-nasal drainage  Start on Azithromycin and Flonase for treatment - azithromycin (ZITHROMAX) 250 MG tablet; 2 tabs po day 1, then 1 tab po q day x 4 days  Dispense: 6 tablet; Refill: 0 - fluticasone (FLONASE) 50 MCG/ACT nasal spray; Place 2 sprays into both nostrils daily.  Dispense: 16 g; Refill: 0  2. Wheezing Recommended to use albuterol inhaler for wheezing. - albuterol (PROVENTIL HFA;VENTOLIN HFA) 108 (90 Base) MCG/ACT inhaler; Inhale 2 puffs into the lungs every 8 (eight) hours as needed for wheezing or shortness of breath.  Dispense: 1 Inhaler; Refill: 0  3. Vitamin B 12 deficiency  - B12  4. Generalized anxiety disorder Stable, responsive to alprazolam taken 3 times daily as needed. Patient not taking citalopram - ALPRAZolam (XANAX) 0.5 MG tablet; Take 1 tablet (0.5 mg total) by mouth 3 (three) times daily as needed for anxiety.  Dispense: 90 tablet; Refill: 2   Abie Killian [name] 08/26/2016 2:25 PM

## 2016-11-25 ENCOUNTER — Ambulatory Visit (INDEPENDENT_AMBULATORY_CARE_PROVIDER_SITE_OTHER): Payer: Medicaid Other | Admitting: Family Medicine

## 2016-11-25 ENCOUNTER — Encounter: Payer: Self-pay | Admitting: Family Medicine

## 2016-11-25 VITALS — BP 132/80 | HR 87 | Temp 98.5°F | Resp 18 | Ht 66.0 in | Wt 222.1 lb

## 2016-11-25 DIAGNOSIS — E782 Mixed hyperlipidemia: Secondary | ICD-10-CM

## 2016-11-25 DIAGNOSIS — Z23 Encounter for immunization: Secondary | ICD-10-CM

## 2016-11-25 DIAGNOSIS — E785 Hyperlipidemia, unspecified: Secondary | ICD-10-CM | POA: Insufficient documentation

## 2016-11-25 DIAGNOSIS — F411 Generalized anxiety disorder: Secondary | ICD-10-CM | POA: Diagnosis not present

## 2016-11-25 MED ORDER — ALPRAZOLAM 0.5 MG PO TABS: 0.5000 mg | ORAL_TABLET | Freq: Three times a day (TID) | ORAL | 2 refills | Status: DC | PRN

## 2016-11-25 NOTE — Progress Notes (Signed)
Name: James ApleyJames B [name]   MRN: [id number]    DOB: 05/20/1956   Date:11/25/2016       Progress Note  Subjective  Chief Complaint  Chief Complaint  Patient presents with  . Follow-up    Anxiety  Presents for follow-up visit. Symptoms include excessive worry, irritability, malaise and nervous/anxious behavior. Patient reports no depressed mood, muscle tension or panic. The severity of symptoms is moderate.    Hyperlipidemia  This is a chronic problem. The problem is uncontrolled. Recent lipid tests were reviewed and are high. He is currently on no antihyperlipidemic treatment (Has been on Simvastatin and Lovastatin, both of which caused myalgias).    Past Medical History:  Diagnosis Date  . Anxiety    history of  . Depression    history of   . Eyes sensitive to light    due to side effect of manufactured version of Gabapentin   . Family history of colonic polyps   . Hand pain, right   . History of palpitations    intermittent; patient states heart skips a beat at times  . Hyperlipidemia   . Hypertension 2007  . Insomnia   . Shoulder injury 1976  . Special screening for malignant neoplasms, colon   . Spinal stenosis of lumbar region     Past Surgical History:  Procedure Laterality Date  . CARDIAC CATHETERIZATION  2007  . COLONOSCOPY  2013  . KNEE SURGERY Left 1979  . POLYPECTOMY  2013  . TONSILLECTOMY  1964    Family History  Problem Relation Age of Onset  . Colon cancer Father   . Cancer Father     colon  . Transient ischemic attack Father     history of  . Arthritis Mother   . Heart failure Mother   . Hypertension Mother   . Kidney failure Mother     Social History   Social History  . Marital status: Single    Spouse name: N/A  . Number of children: N/A  . Years of education: N/A   Occupational History  . Not on file.   Social History Main Topics  . Smoking status: Former Smoker    Packs/day: 1.00    Years: 25.00  . Smokeless tobacco: Never Used   . Alcohol use No  . Drug use: No  . Sexual activity: No   Other Topics Concern  . Not on file   Social History Narrative  . No narrative on file     Current Outpatient Prescriptions:  .  albuterol (PROVENTIL HFA;VENTOLIN HFA) 108 (90 Base) MCG/ACT inhaler, Inhale 2 puffs into the lungs every 8 (eight) hours as needed for wheezing or shortness of breath., Disp: 1 Inhaler, Rfl: 0 .  ALPRAZolam (XANAX) 0.5 MG tablet, Take 1 tablet (0.5 mg total) by mouth 3 (three) times daily as needed for anxiety., Disp: 90 tablet, Rfl: 2 .  carvedilol (COREG) 6.25 MG tablet, Take 1 tablet (6.25 mg total) by mouth 2 (two) times daily., Disp: 60 tablet, Rfl: 3 .  citalopram (CELEXA) 10 MG tablet, Take 1 tablet (10 mg total) by mouth daily., Disp: 30 tablet, Rfl: 2 .  fluticasone (FLONASE) 50 MCG/ACT nasal spray, Place 2 sprays into both nostrils daily., Disp: 16 g, Rfl: 0  Allergies  Allergen Reactions  . Naprosyn [Naproxen] Itching     Review of Systems  Constitutional: Positive for irritability.  Psychiatric/Behavioral: The patient is nervous/anxious.      Objective  Vitals:   11/25/16 1500  BP: 132/80  Pulse: 87  Resp: 18  Temp: 98.5 F (36.9 C)  TempSrc: Oral  SpO2: 97%  Weight: 222 lb 1.6 oz (100.7 kg)  Height: 5\' 6"  (1.676 m)    Physical Exam  Constitutional: He is oriented to person, place, and time and well-developed, well-nourished, and in no distress.  HENT:  Head: Normocephalic and atraumatic.  Cardiovascular: Normal rate, regular rhythm and normal heart sounds.   No murmur heard. Pulmonary/Chest: Effort normal and breath sounds normal. He has no wheezes.  Neurological: He is alert and oriented to person, place, and time.  Psychiatric: Mood, memory, affect and judgment normal.  Nursing note and vitals reviewed.       Assessment & Plan  1. Generalized anxiety disorder Stable and responsive to alprazolam taken up to 3 times daily when needed, refills  provided - ALPRAZolam (XANAX) 0.5 MG tablet; Take 1 tablet (0.5 mg total) by mouth 3 (three) times daily as needed for anxiety.  Dispense: 90 tablet; Refill: 2  2. Mixed hyperlipidemia FLP from June 2017 reviewed, shows elevated total and LDL cholesterol and triglycerides. We will repeat today and consider starting on statin therapy - Lipid Profile - COMPLETE METABOLIC PANEL WITH GFR  3. Need for immunization against influenza  - Flu Vaccine QUAD 36+ mos PF IM (Fluarix & Fluzone Quad PF)   Syed [facility] 11/25/2016 3:02 PM

## 2016-12-17 ENCOUNTER — Other Ambulatory Visit: Payer: Self-pay | Admitting: *Deleted

## 2016-12-17 MED ORDER — CARVEDILOL 6.25 MG PO TABS: 6.2500 mg | ORAL_TABLET | Freq: Two times a day (BID) | ORAL | 3 refills | Status: DC

## 2017-01-08 ENCOUNTER — Ambulatory Visit: Payer: Self-pay | Admitting: General Surgery

## 2017-02-10 ENCOUNTER — Encounter: Payer: Self-pay | Admitting: Cardiovascular Disease

## 2017-02-10 ENCOUNTER — Ambulatory Visit (INDEPENDENT_AMBULATORY_CARE_PROVIDER_SITE_OTHER): Payer: Medicaid Other | Admitting: Cardiovascular Disease

## 2017-02-10 VITALS — BP 132/82 | HR 73 | Ht 67.0 in | Wt 228.0 lb

## 2017-02-10 DIAGNOSIS — E782 Mixed hyperlipidemia: Secondary | ICD-10-CM

## 2017-02-10 DIAGNOSIS — I10 Essential (primary) hypertension: Secondary | ICD-10-CM | POA: Diagnosis not present

## 2017-02-10 DIAGNOSIS — I447 Left bundle-branch block, unspecified: Secondary | ICD-10-CM

## 2017-02-10 NOTE — Patient Instructions (Signed)
Medication Instructions: No changes.   Labwork: None.   Procedures/Testing: None.   Follow-Up: As needed with Dr. Arida.   Any Additional Special Instructions Will Be Listed Below (If Applicable).     If you need a refill on your cardiac medications before your next appointment, please call your pharmacy.   

## 2017-02-10 NOTE — Progress Notes (Signed)
Cardiology Office Note   Date:  02/10/2017   ID:  James [name], DOB 1955-11-19, MRN [id number]  PCP:  Brayton El, MD  Cardiologist:   Lorine Bears, MD   Chief Complaint  Patient presents with  . other    12 month follow up. Patient states he is doing well. Meds reviewed verbally with patient.       History of Present Illness: James [name] is a 61 y.o. male who presents for a follow-up visit regarding left bundle branch block and hypertension. Reported cardiac catheterization in 2007 showed no obstructive disease.  He has known history of hypertension , hyperlipidemia and previous tobacco use.  He had cardiac workup in 2017 for exertional dyspnea. Echocardiogram showed low normal LV systolic function with an ejection fraction of 50-55%. Pharmacologic nuclear stress test showed no evidence of ischemia with normal ejection fraction.   He has been doing well with no chest pain or worsening dyspnea. He has gained some weight since last year and he has not been very active this winter.  Past Medical History:  Diagnosis Date  . Anxiety    history of  . Depression    history of   . Eyes sensitive to light    due to side effect of manufactured version of Gabapentin   . Family history of colonic polyps   . Hand pain, right   . History of palpitations    intermittent; patient states heart skips a beat at times  . Hyperlipidemia   . Hypertension 2007  . Insomnia   . Shoulder injury 1976  . Special screening for malignant neoplasms, colon   . Spinal stenosis of lumbar region     Past Surgical History:  Procedure Laterality Date  . CARDIAC CATHETERIZATION  2007  . COLONOSCOPY  2013  . KNEE SURGERY Left 1979  . POLYPECTOMY  2013  . TONSILLECTOMY  1964     Current Outpatient Prescriptions  Medication Sig Dispense Refill  . albuterol (PROVENTIL HFA;VENTOLIN HFA) 108 (90 Base) MCG/ACT inhaler Inhale 2 puffs into the lungs every 8 (eight) hours as needed for wheezing or  shortness of breath. 1 Inhaler 0  . ALPRAZolam (XANAX) 0.5 MG tablet Take 1 tablet (0.5 mg total) by mouth 3 (three) times daily as needed for anxiety. 90 tablet 2  . carvedilol (COREG) 6.25 MG tablet Take 1 tablet (6.25 mg total) by mouth 2 (two) times daily. 60 tablet 3  . fluticasone (FLONASE) 50 MCG/ACT nasal spray Place 2 sprays into both nostrils daily. 16 g 0   No current facility-administered medications for this visit.     Allergies:   Naprosyn [naproxen]    Social History:  The patient  reports that he has quit smoking. He has a 25.00 pack-year smoking history. He has never used smokeless tobacco. He reports that he does not drink alcohol or use drugs.   Family History:  The patient's family history includes Arthritis in his mother; Cancer in his father; Colon cancer in his father; Heart failure in his mother; Hypertension in his mother; Kidney failure in his mother; Transient ischemic attack in his father.    ROS:  Please see the history of present illness.   Otherwise, review of systems are positive for none.   All other systems are reviewed and negative.    PHYSICAL EXAM: VS:  BP 132/82 (BP Location: Left Arm, Patient Position: Sitting, Cuff Size: Normal)   Pulse 73   Ht  (1.702 m)  Wt 228 lb (103.4 kg)   BMI 35.71 kg/m  , BMI Body mass index is 35.71 kg/m. GEN: Well nourished, well developed, in no acute distress  HEENT: normal  Neck: no JVD, carotid bruits, or masses Cardiac: RRR; no murmurs, rubs, or gallops,no edema  Respiratory:  clear to auscultation bilaterally, normal work of breathing GI: soft, nontender, nondistended, + BS MS: no deformity or atrophy  Skin: warm and dry, no rash Neuro:  Strength and sensation are intact Psych: euthymic mood, full affect   EKG:  EKG is ordered today. EKG showed normal sinus rhythm with left bundle branch block.   Recent Labs: 04/22/2016: ALT 14; BUN 17; Creat 1.15; Potassium 5.1; Sodium 138; TSH 1.07    Lipid  Panel    Component Value Date/Time   CHOL 246 (H) 04/22/2016 1522   TRIG 290 (H) 04/22/2016 1522   HDL 39 (L) 04/22/2016 1522   CHOLHDL 6.3 (H) 04/22/2016 1522   VLDL 58 (H) 04/22/2016 1522   LDLCALC 149 (H) 04/22/2016 1522      Wt Readings from Last 3 Encounters:  02/10/17 228 lb (103.4 kg)  11/25/16 222 lb 1.6 oz (100.7 kg)  08/26/16 218 lb 11.2 oz (99.2 kg)        ASSESSMENT AND PLAN:  1.  Left bundle branch block:  The patient has no symptoms of ischemic heart disease, heart failure or arrhythmia. He can be monitored clinically and follow-up with me as needed. I provided him with a copy of his EKG.  2. Essential hypertension: Blood pressure is reasonably controlled on carvedilol.  3. Hyperlipidemia: I reviewed most recent lipid profile with him which was done in June 2017. It showed an LDL of 149, HDL of 39 and triglyceride of 290. I discussed with him the importance of healthy diet, exercise and weight loss.    Disposition:   FU with me as needed.   Signed,  Lorine Bears, MD  02/10/2017 3:33 PM    [facility]

## 2017-02-24 ENCOUNTER — Ambulatory Visit: Payer: Medicaid Other | Admitting: Family Medicine

## 2017-03-03 ENCOUNTER — Encounter: Payer: Self-pay | Admitting: Family Medicine

## 2017-03-03 ENCOUNTER — Ambulatory Visit (INDEPENDENT_AMBULATORY_CARE_PROVIDER_SITE_OTHER): Payer: Medicaid Other | Admitting: Family Medicine

## 2017-03-03 VITALS — BP 132/84 | HR 85 | Temp 98.6°F | Resp 18 | Ht 67.0 in | Wt 227.0 lb

## 2017-03-03 DIAGNOSIS — R0982 Postnasal drip: Secondary | ICD-10-CM

## 2017-03-03 DIAGNOSIS — F411 Generalized anxiety disorder: Secondary | ICD-10-CM

## 2017-03-03 DIAGNOSIS — E782 Mixed hyperlipidemia: Secondary | ICD-10-CM | POA: Diagnosis not present

## 2017-03-03 MED ORDER — ALPRAZOLAM 0.5 MG PO TABS: 0.5000 mg | ORAL_TABLET | Freq: Three times a day (TID) | ORAL | 2 refills | Status: DC | PRN

## 2017-03-03 MED ORDER — AZITHROMYCIN 250 MG PO TABS: ORAL_TABLET | ORAL | 0 refills | Status: DC

## 2017-03-03 MED ORDER — FLUTICASONE PROPIONATE 50 MCG/ACT NA SUSP: 2.0000 | Freq: Every day | NASAL | 0 refills | Status: DC

## 2017-03-03 NOTE — Progress Notes (Signed)
Name: James [name]   MRN: [id number]    DOB: February 16, 1956   Date:03/03/2017       Progress Note  Subjective  Chief Complaint  Chief Complaint  Patient presents with  . Follow-up    3 mo  . Medication Refill    Anxiety  Presents for follow-up visit. Symptoms include excessive worry, insomnia and nervous/anxious behavior. Patient reports no depressed mood, muscle tension or panic. The severity of symptoms is moderate and causing significant distress.     Patient has symptoms similar to drainage in his throat, feels that there is mucus in his throat that he has to constantly swallow, sometimes has to cough up to clear his throat, no fevers, chills, ear pain, or dyspnea. He has tried [employer] and was prescribed antibiotic in October, which seems to help.   Past Medical History:  Diagnosis Date  . Anxiety    history of  . Depression    history of   . Eyes sensitive to light    due to side effect of manufactured version of Gabapentin   . Family history of colonic polyps   . Hand pain, right   . History of palpitations    intermittent; patient states heart skips a beat at times  . Hyperlipidemia   . Hypertension 2007  . Insomnia   . Shoulder injury 1976  . Special screening for malignant neoplasms, colon   . Spinal stenosis of lumbar region     Past Surgical History:  Procedure Laterality Date  . CARDIAC CATHETERIZATION  2007  . COLONOSCOPY  2013  . KNEE SURGERY Left 1979  . POLYPECTOMY  2013  . TONSILLECTOMY  1964    Family History  Problem Relation Age of Onset  . Colon cancer Father   . Cancer Father     colon  . Transient ischemic attack Father     history of  . Arthritis Mother   . Heart failure Mother   . Hypertension Mother   . Kidney failure Mother     Social History   Social History  . Marital status: Single    Spouse name: N/A  . Number of children: N/A  . Years of education: N/A   Occupational History  . Not on file.   Social History Main  Topics  . Smoking status: Former Smoker    Packs/day: 1.00    Years: 25.00  . Smokeless tobacco: Never Used  . Alcohol use No  . Drug use: No  . Sexual activity: No   Other Topics Concern  . Not on file   Social History Narrative  . No narrative on file     Current Outpatient Prescriptions:  .  albuterol (PROVENTIL HFA;VENTOLIN HFA) 108 (90 Base) MCG/ACT inhaler, Inhale 2 puffs into the lungs every 8 (eight) hours as needed for wheezing or shortness of breath., Disp: 1 Inhaler, Rfl: 0 .  ALPRAZolam (XANAX) 0.5 MG tablet, Take 1 tablet (0.5 mg total) by mouth 3 (three) times daily as needed for anxiety., Disp: 90 tablet, Rfl: 2 .  carvedilol (COREG) 6.25 MG tablet, Take 1 tablet (6.25 mg total) by mouth 2 (two) times daily., Disp: 60 tablet, Rfl: 3 .  fluticasone (FLONASE) 50 MCG/ACT nasal spray, Place 2 sprays into both nostrils daily., Disp: 16 g, Rfl: 0  Allergies  Allergen Reactions  . Naprosyn [Naproxen] Itching     Review of Systems  Psychiatric/Behavioral: The patient is nervous/anxious and has insomnia.     Please see history  of present illness for complete discussion of ROS  Objective  Vitals:   03/03/17 1424  BP: 132/84  Pulse: 85  Resp: 18  Temp: 98.6 F (37 C)  TempSrc: Oral  SpO2: 95%  Weight: 227 lb (103 kg)  Height:  (1.702 m)    Physical Exam  Constitutional: He is oriented to person, place, and time and well-developed, well-nourished, and in no distress.  HENT:  Head: Normocephalic and atraumatic.  Right Ear: Tympanic membrane and ear canal normal. No drainage.  Left Ear: Tympanic membrane and ear canal normal. No drainage.  Nose: Right sinus exhibits no maxillary sinus tenderness and no frontal sinus tenderness. Left sinus exhibits no maxillary sinus tenderness and no frontal sinus tenderness.  Mouth/Throat: Posterior oropharyngeal erythema (mucus visible at oropharynx.) present.  Nasal turbinates hypertrophied, mild mucosal inflammation   Cardiovascular: Normal rate, regular rhythm and normal heart sounds.   No murmur heard. Pulmonary/Chest: Effort normal and breath sounds normal. He has no wheezes.  Neurological: He is alert and oriented to person, place, and time.  Psychiatric: Mood, memory, affect and judgment normal.  Nursing note and vitals reviewed.      Assessment & Plan  1. Generalized anxiety disorder Stable and responsive to alprazolam taken as needed, refills provided - ALPRAZolam (XANAX) 0.5 MG tablet; Take 1 tablet (0.5 mg total) by mouth 3 (three) times daily as needed for anxiety.  Dispense: 90 tablet; Refill: 2  2. Post-nasal drainage We'll start on azithromycin for postnasal drainage, continue on Flonase, if symptoms persist, consider Singulair - azithromycin (ZITHROMAX) 250 MG tablet; 2 tabs po day 1, then 1 tab po q day x 4 days  Dispense: 6 tablet; Refill: 0 - fluticasone (FLONASE) 50 MCG/ACT nasal spray; Place 2 sprays into both nostrils daily.  Dispense: 16 g; Refill: 0  3. Mixed hyperlipidemia Patient has not been able to obtain FLP as yet, review when available and consider starting on statin   Rosalie Buenaventura Asad [facility] 03/03/2017 2:36 PM

## 2017-04-20 ENCOUNTER — Other Ambulatory Visit: Payer: Self-pay | Admitting: Cardiovascular Disease

## 2017-04-23 ENCOUNTER — Encounter: Payer: Medicaid Other | Admitting: Family Medicine

## 2017-06-16 ENCOUNTER — Ambulatory Visit (INDEPENDENT_AMBULATORY_CARE_PROVIDER_SITE_OTHER): Payer: Medicaid Other | Admitting: Family Medicine

## 2017-06-16 ENCOUNTER — Encounter: Payer: Self-pay | Admitting: Family Medicine

## 2017-06-16 VITALS — BP 135/79 | HR 81 | Temp 98.1°F | Resp 16 | Ht 67.0 in | Wt 225.9 lb

## 2017-06-16 DIAGNOSIS — Z1159 Encounter for screening for other viral diseases: Secondary | ICD-10-CM | POA: Diagnosis not present

## 2017-06-16 DIAGNOSIS — Z0000 Encounter for general adult medical examination without abnormal findings: Secondary | ICD-10-CM

## 2017-06-16 DIAGNOSIS — Z125 Encounter for screening for malignant neoplasm of prostate: Secondary | ICD-10-CM | POA: Diagnosis not present

## 2017-06-16 DIAGNOSIS — F411 Generalized anxiety disorder: Secondary | ICD-10-CM | POA: Diagnosis not present

## 2017-06-16 LAB — CBC WITH DIFFERENTIAL/PLATELET
BASOS PCT: 0 %
Basophils Absolute: 0 cells/uL (ref 0–200)
EOS ABS: 204 {cells}/uL (ref 15–500)
Eosinophils Relative: 3 %
HEMATOCRIT: 44.9 % (ref 38.5–50.0)
HEMOGLOBIN: 15.2 g/dL (ref 13.2–17.1)
LYMPHS ABS: 1360 {cells}/uL (ref 850–3900)
Lymphocytes Relative: 20 %
MCH: 29.6 pg (ref 27.0–33.0)
MCHC: 33.9 g/dL (ref 32.0–36.0)
MCV: 87.5 fL (ref 80.0–100.0)
MONO ABS: 408 {cells}/uL (ref 200–950)
MPV: 10.4 fL (ref 7.5–12.5)
Monocytes Relative: 6 %
Neutro Abs: 4828 cells/uL (ref 1500–7800)
Neutrophils Relative %: 71 %
Platelets: 195 10*3/uL (ref 140–400)
RBC: 5.13 MIL/uL (ref 4.20–5.80)
RDW: 14.6 % (ref 11.0–15.0)
WBC: 6.8 10*3/uL (ref 3.8–10.8)

## 2017-06-16 MED ORDER — ALPRAZOLAM 0.5 MG PO TABS: 0.5000 mg | ORAL_TABLET | Freq: Three times a day (TID) | ORAL | 2 refills | Status: DC | PRN

## 2017-06-16 NOTE — Progress Notes (Signed)
Name: James [name]   MRN: [id number]    DOB: Jun 15, 1956   Date:06/16/2017       Progress Note  Subjective  Chief Complaint  Chief Complaint  Patient presents with  . Annual Exam    CPE    Anxiety  Presents for follow-up visit. Patient reports no chest pain, dizziness, insomnia, nausea, nervous/anxious behavior (on treatment), palpitations or shortness of breath. The quality of sleep is good.      Pt. Presents for Complete Physical Exam.  He is due for screening colonoscopy, last done in 2013. He is due for prostate cancer and Hepatitis C screening.    Past Medical History:  Diagnosis Date  . Anxiety    history of  . Depression    history of   . Eyes sensitive to light    due to side effect of manufactured version of Gabapentin   . Family history of colonic polyps   . Hand pain, right   . History of palpitations    intermittent; patient states heart skips a beat at times  . Hyperlipidemia   . Hypertension 2007  . Insomnia   . Shoulder injury 1976  . Special screening for malignant neoplasms, colon   . Spinal stenosis of lumbar region     Past Surgical History:  Procedure Laterality Date  . CARDIAC CATHETERIZATION  2007  . COLONOSCOPY  2013  . KNEE SURGERY Left 1979  . POLYPECTOMY  2013  . TONSILLECTOMY  1964    Family History  Problem Relation Age of Onset  . Colon cancer Father   . Cancer Father        colon  . Transient ischemic attack Father        history of  . Arthritis Mother   . Heart failure Mother   . Hypertension Mother   . Kidney failure Mother     Social History   Social History  . Marital status: Single    Spouse name: N/A  . Number of children: N/A  . Years of education: N/A   Occupational History  . Not on file.   Social History Main Topics  . Smoking status: Former Smoker    Packs/day: 1.00    Years: 25.00  . Smokeless tobacco: Never Used  . Alcohol use No  . Drug use: No  . Sexual activity: No   Other Topics Concern   . Not on file   Social History Narrative  . No narrative on file     Current Outpatient Prescriptions:  .  albuterol (PROVENTIL HFA;VENTOLIN HFA) 108 (90 Base) MCG/ACT inhaler, Inhale 2 puffs into the lungs every 8 (eight) hours as needed for wheezing or shortness of breath., Disp: 1 Inhaler, Rfl: 0 .  ALPRAZolam (XANAX) 0.5 MG tablet, Take 1 tablet (0.5 mg total) by mouth 3 (three) times daily as needed for anxiety., Disp: 90 tablet, Rfl: 2 .  carvedilol (COREG) 6.25 MG tablet, Take 1 tablet (6.25 mg total) by mouth 2 (two) times daily., Disp: 60 tablet, Rfl: 3 .  fluticasone (FLONASE) 50 MCG/ACT nasal spray, Place 2 sprays into both nostrils daily., Disp: 16 g, Rfl: 0 .  azithromycin (ZITHROMAX) 250 MG tablet, 2 tabs po day 1, then 1 tab po q day x 4 days (Patient not taking: Reported on 06/16/2017), Disp: 6 tablet, Rfl: 0  Allergies  Allergen Reactions  . Naprosyn [Naproxen] Itching     Review of Systems  Constitutional: Negative for chills, fever, malaise/fatigue and weight loss.  HENT: Negative for congestion, ear pain and sore throat (minor sore throat occasionally.).   Eyes: Negative for blurred vision and double vision.  Respiratory: Negative for cough and shortness of breath.   Cardiovascular: Negative for chest pain and palpitations.  Gastrointestinal: Negative for abdominal pain, blood in stool, constipation, diarrhea, nausea and vomiting.  Genitourinary: Negative for dysuria, hematuria and urgency.  Musculoskeletal: Positive for back pain. Negative for myalgias and neck pain.  Neurological: Negative for dizziness and headaches.  Psychiatric/Behavioral: Negative for depression. The patient is not nervous/anxious (on treatment) and does not have insomnia.       Objective  Vitals:   06/16/17 1443  BP: 135/79  Pulse: 81  Resp: 16  Temp: 98.1 F (36.7 C)  TempSrc: Oral  SpO2: 96%  Weight: 225 lb 14.4 oz (102.5 kg)  Height: 5\' 7"  (1.702 m)    Physical Exam   Constitutional: He is oriented to person, place, and time and well-developed, well-nourished, and in no distress.  HENT:  Head: Normocephalic and atraumatic.  Right Ear: External ear normal.  Left Ear: External ear normal.  Mouth/Throat: Oropharynx is clear and moist.  Eyes: Pupils are equal, round, and reactive to light.  Neck: Neck supple.  Cardiovascular: Normal rate, regular rhythm and normal heart sounds.   No murmur heard. Pulmonary/Chest: Effort normal and breath sounds normal. He has no wheezes.  Abdominal: Soft. Bowel sounds are normal. There is no tenderness.  Musculoskeletal: He exhibits no edema.  Neurological: He is alert and oriented to person, place, and time.  Psychiatric: Mood, memory, affect and judgment normal.  Nursing note and vitals reviewed.      Assessment & Plan  1. Annual physical exam Obtain age-appropriate laboratory screening - CBC with Differential/Platelet - TSH - VITAMIN D 25 Hydroxy (Vit-D Deficiency, Fractures)  2. Prostate cancer screening  - PSA  3. Need for hepatitis C screening test  - Hepatitis C antibody  4. Generalized anxiety disorder Stable, responsive to alprazolam taken as needed for anxiety and stress, refills provided. - ALPRAZolam (XANAX) 0.5 MG tablet; Take 1 tablet (0.5 mg total) by mouth 3 (three) times daily as needed for anxiety.  Dispense: 90 tablet; Refill: 2 '  Jillana Selph [name] [name] 06/16/2017 2:55 PM

## 2017-06-17 LAB — VITAMIN D 25 HYDROXY (VIT D DEFICIENCY, FRACTURES): VIT D 25 HYDROXY: 14 ng/mL — AB (ref 30–100)

## 2017-06-17 LAB — HEPATITIS C ANTIBODY: HCV AB: NONREACTIVE

## 2017-06-17 LAB — TSH: TSH: 0.55 mIU/L (ref 0.40–4.50)

## 2017-06-17 LAB — PSA: PSA: 0.8 ng/mL (ref <=4.0)

## 2017-07-01 ENCOUNTER — Telehealth: Payer: Self-pay

## 2017-07-01 MED ORDER — VITAMIN D (ERGOCALCIFEROL) 1.25 MG (50000 UNIT) PO CAPS: 50000.0000 [IU] | ORAL_CAPSULE | ORAL | 0 refills | Status: DC

## 2017-07-01 NOTE — Telephone Encounter (Signed)
Patient has been notified of lab results and a prescription for vitamin D3 50,000 units take 1 capsule once a week x12 weeks has been sent to [facility] per Dr. Sherryll Burger, patient has been notified

## 2017-07-13 ENCOUNTER — Other Ambulatory Visit: Payer: Self-pay | Admitting: Cardiovascular Disease

## 2017-09-02 ENCOUNTER — Ambulatory Visit: Payer: Self-pay | Admitting: General Surgery

## 2017-09-02 ENCOUNTER — Telehealth: Payer: Self-pay | Admitting: General Surgery

## 2017-09-02 NOTE — Telephone Encounter (Signed)
PATIENT HAD APPOINTMENT SCHEDULED FOR 09-02-17 WITH DR Lemar LivingsBYRNETT FOR A 66YR COLONOSCOPY AND CANCELLED APPOINTMENT VIA AUTO SYSTEM.I SPOKE TO PATIENT AND HE WILL CALL BACK ONCE HE HAS TRANSPORTATION LINED UP.

## 2017-09-15 ENCOUNTER — Ambulatory Visit: Payer: Medicaid Other | Admitting: Family Medicine

## 2017-09-15 ENCOUNTER — Encounter: Payer: Self-pay | Admitting: Family Medicine

## 2017-09-15 VITALS — BP 150/96 | HR 86 | Temp 98.2°F | Resp 16 | Ht 67.0 in | Wt 223.1 lb

## 2017-09-15 DIAGNOSIS — E559 Vitamin D deficiency, unspecified: Secondary | ICD-10-CM | POA: Insufficient documentation

## 2017-09-15 DIAGNOSIS — I10 Essential (primary) hypertension: Secondary | ICD-10-CM

## 2017-09-15 DIAGNOSIS — F411 Generalized anxiety disorder: Secondary | ICD-10-CM

## 2017-09-15 DIAGNOSIS — R062 Wheezing: Secondary | ICD-10-CM | POA: Diagnosis not present

## 2017-09-15 MED ORDER — ALPRAZOLAM 0.5 MG PO TABS: 0.5000 mg | ORAL_TABLET | Freq: Three times a day (TID) | ORAL | 2 refills | Status: AC | PRN

## 2017-09-15 MED ORDER — ALBUTEROL SULFATE HFA 108 (90 BASE) MCG/ACT IN AERS: 2.0000 | INHALATION_SPRAY | Freq: Four times a day (QID) | RESPIRATORY_TRACT | 2 refills | Status: DC | PRN

## 2017-09-15 MED ORDER — CARVEDILOL 6.25 MG PO TABS: ORAL_TABLET | ORAL | 3 refills | Status: DC

## 2017-09-15 NOTE — Progress Notes (Signed)
Name: James [name]   MRN: [id number]    DOB: 11/11/1955   Date:09/15/2017       Progress Note  Subjective  Chief Complaint  Chief Complaint  Patient presents with  . Medication Refill    xanax  . Anxiety    Anxiety  Presents for follow-up visit. Symptoms include excessive worry, insomnia, irritability, nervous/anxious behavior and panic. Patient reports no depressed mood or shortness of breath. Primary symptoms comment: anxiety is worse in last few days his mother fell and broke her nose, roof of his house is elaking under a tremendous amount of stress . The severity of symptoms is moderate and causing significant distress. The quality of sleep is poor.      Past Medical History:  Diagnosis Date  . Anxiety    history of  . Depression    history of   . Eyes sensitive to light    due to side effect of manufactured version of Gabapentin   . Family history of colonic polyps   . Hand pain, right   . History of palpitations    intermittent; patient states heart skips a beat at times  . Hyperlipidemia   . Hypertension 2007  . Insomnia   . Shoulder injury 1976  . Special screening for malignant neoplasms, colon   . Spinal stenosis of lumbar region     Past Surgical History:  Procedure Laterality Date  . CARDIAC CATHETERIZATION  2007  . COLONOSCOPY  2013  . KNEE SURGERY Left 1979  . POLYPECTOMY  2013  . TONSILLECTOMY  1964    Family History  Problem Relation Age of Onset  . Colon cancer Father   . Cancer Father        colon  . Transient ischemic attack Father        history of  . Arthritis Mother   . Heart failure Mother   . Hypertension Mother   . Kidney failure Mother     Social History   Socioeconomic History  . Marital status: Single    Spouse name: Not on file  . Number of children: Not on file  . Years of education: Not on file  . Highest education level: Not on file  Social Needs  . Financial resource strain: Not on file  . Food insecurity -  worry: Not on file  . Food insecurity - inability: Not on file  . Transportation needs - medical: Not on file  . Transportation needs - non-medical: Not on file  Occupational History  . Not on file  Tobacco Use  . Smoking status: Former Smoker    Packs/day: 1.00    Years: 25.00    Pack years: 25.00  . Smokeless tobacco: Never Used  Substance and Sexual Activity  . Alcohol use: No    Alcohol/week: 0.0 oz  . Drug use: No  . Sexual activity: No  Other Topics Concern  . Not on file  Social History Narrative  . Not on file     Current Outpatient Medications:  .  albuterol (PROVENTIL HFA;VENTOLIN HFA) 108 (90 Base) MCG/ACT inhaler, Inhale 2 puffs into the lungs every 8 (eight) hours as needed for wheezing or shortness of breath., Disp: 1 Inhaler, Rfl: 0 .  ALPRAZolam (XANAX) 0.5 MG tablet, Take 1 tablet (0.5 mg total) by mouth 3 (three) times daily as needed for anxiety., Disp: 90 tablet, Rfl: 2 .  carvedilol (COREG) 6.25 MG tablet, Take 1 tablet (6.25 mg total) by mouth 2 (two)  times daily., Disp: 180 tablet, Rfl: 3 .  fluticasone (FLONASE) 50 MCG/ACT nasal spray, Place 2 sprays into both nostrils daily., Disp: 16 g, Rfl: 0 .  azithromycin (ZITHROMAX) 250 MG tablet, 2 tabs po day 1, then 1 tab po q day x 4 days (Patient not taking: Reported on 09/15/2017), Disp: 6 tablet, Rfl: 0 .  Vitamin D, Ergocalciferol, (DRISDOL) 50000 units CAPS capsule, Take 1 capsule (50,000 Units total) by mouth once a week. For 12 weeks (Patient not taking: Reported on 09/15/2017), Disp: 12 capsule, Rfl: 0  Allergies  Allergen Reactions  . Naprosyn [Naproxen] Itching     Review of Systems  Constitutional: Positive for irritability.  Respiratory: Negative for shortness of breath.   Psychiatric/Behavioral: The patient is nervous/anxious and has insomnia.      Objective  Vitals:   09/15/17 1523 09/15/17 1526  BP: (!) 158/98 (!) 150/96  Pulse: 86   Resp: 16   Temp: 98.2 F (36.8 C)   TempSrc:  Oral   SpO2: 97%   Weight: 223 lb 1.6 oz (101.2 kg)   Height: 5\' 7"  (1.702 m)     Physical Exam  Constitutional: He is oriented to person, place, and time and well-developed, well-nourished, and in no distress.  HENT:  Head: Normocephalic and atraumatic.  Cardiovascular: Normal rate, regular rhythm and normal heart sounds.  No murmur heard. Pulmonary/Chest: Effort normal. He has wheezes in the right lower field and the left lower field.  End-expiratory wheezing in both right and left lung fields.    Neurological: He is alert and oriented to person, place, and time.  Psychiatric: Mood, memory, affect and judgment normal.  Nursing note and vitals reviewed.       Assessment & Plan  1. Generalized anxiety disorder Stable although acutely worse because of stressors, continue on alprazolam as needed, refills provided. - ALPRAZolam (XANAX) 0.5 MG tablet; Take 1 tablet (0.5 mg total) 3 (three) times daily as needed by mouth for anxiety.  Dispense: 90 tablet; Refill: 2 - Ambulatory referral to Psychiatry  2. Essential hypertension Blood pressure elevated because of the anxiety, will increase Carvedilol to 12.5 mg in AM, continue at 6.25 mg in PM, follow up in 1 month. - carvedilol (COREG) 6.25 MG tablet; Take 2 tabs of Carvedilol 6.25 mg in AM, 1 tab in PM  Dispense: 180 tablet; Refill: 3  3. Vitamin D deficiency Obtain levels after completing Vitamin D treatment. - VITAMIN D 25 Hydroxy (Vit-D Deficiency, Fractures)  4. Wheezing  - albuterol (VENTOLIN HFA) 108 (90 Base) MCG/ACT inhaler; Inhale 2 puffs every 6 (six) hours as needed into the lungs for wheezing or shortness of breath.  Dispense: 1 Inhaler; Refill: 2   James [name] [name] [name] 09/15/2017 3:44 PM

## 2017-09-15 NOTE — Progress Notes (Signed)
The following letter was provided to James [name] during their visit today: ---------------------------------------  Dear valued [facility] Patient,  I am writing to share that as of December 18, 2017, I will no longer be seeing patients at [facility]. While it has been my privilege to care for you as a physician, I have decided to move outside of [place] to pursue other opportunities.  The [facility] has been supportive of my decision and are supportive of any patients who have been under my care.  They will be happy to provide care to you and your family.  The office staff will do everything they can to ensure a seamless transition of care at [facility].  However if you are on any controlled substance medications (i.e. Pain medication or benzodiazepines), they will no longer be able to refill those medications, but we will be more than happy to refer you to a specialist.  [name] will also assist you with the transfer of medical records should you wish to seek care elsewhere.  If you have any questions about your future care, you may call the office at [phone].  I have enjoyed getting to know my patients here and I wish you the very best.  Sincerely,  James AddisonSyed Asad Shah, MD  ---------------------------------------  A written copy of this letter was given to the patient.  The patient verbalizes understanding of the letter, and does request referral to specialist or to new primary care provider.  This decision has been conveyed to Dr. Sherryll BurgerShah, who will place any appropriate referrals during today's visit.

## 2017-09-16 ENCOUNTER — Telehealth: Payer: Self-pay

## 2017-09-16 DIAGNOSIS — E559 Vitamin D deficiency, unspecified: Secondary | ICD-10-CM

## 2017-09-16 LAB — VITAMIN D 25 HYDROXY (VIT D DEFICIENCY, FRACTURES): Vit D, 25-Hydroxy: 21 ng/mL — ABNORMAL LOW (ref 30–100)

## 2017-09-16 NOTE — Telephone Encounter (Signed)
Pt asking again for a letter to be excused for jury duty. Pt states he is on disability due to his health problems it will be hard for him. He is asking for letter to be excuse for jury duty.

## 2017-09-16 NOTE — Telephone Encounter (Signed)
In general, any disability has to interfere with patient's ability to serve as a juror and to participate in the proceedings. He should consult the specialist who signed off on the disability paperwork

## 2017-09-21 MED ORDER — VITAMIN D (ERGOCALCIFEROL) 1.25 MG (50000 UNIT) PO CAPS: 50000.0000 [IU] | ORAL_CAPSULE | ORAL | 0 refills | Status: DC

## 2017-09-21 NOTE — Telephone Encounter (Signed)
Pt.notified

## 2017-12-11 ENCOUNTER — Telehealth: Payer: Self-pay

## 2017-12-11 NOTE — Telephone Encounter (Signed)
Pt states the name  on his medicaid card will not change until march 1st but pt has an appointment with [facility] next week.  His worker states if [facility] it will go into effect quicker. I mention to the patient Im not familiar with this process and I will send a message to our referral coordinator . I did mention he would have to sign a medical release forms so his records can be released to [facility] but Im not sure about the referral.   Copied from CRM [id number]. Topic: Referral - Request >> Dec 10, 2017  3:39 PM Guinevere FerrariMorris, Sharamare E, NT wrote: Reason for CRM: Patient called and wanted to see if the doctor could give him a referral to [facility]. Pls fax referral  to [phone]

## 2017-12-14 NOTE — Telephone Encounter (Signed)
Pt called to check the status on the referral, pt is needing to know by the 13th b/c its dependent upon if his medicaid will pay for it, contact pt to advise

## 2017-12-15 NOTE — Telephone Encounter (Signed)
I tried to contact this patient to inform him that no referral is needed if he is trying to switch to another primary care office but there was no answer and I was not able to leave a message.  Copied from CRM [id number]. Topic: General - Other >> Dec 14, 2017  2:25 PM James [name], Tashia, [place]MA wrote: Reason for CRM: pt  would like a call back concerning a referral, please call [phone]

## 2017-12-15 NOTE — Telephone Encounter (Signed)
Was able to speak with the receptionist at [facility] [place] and was informed that only the patient's recent office notes and labs needed to be faxed over, which was done at 9:38am.  I also spoke with the patient informing him that his records has been sent and that we do not do referral orders to other primary care offices because we are primary care.  Patient stated that [facility] informed him that a referral was needed if he needed to be seen before January 01, 2018 because that is when the name change on his medicaid card to [facility] will not take effect until January 01, 2018.  I informed patient that if [facility] needed any additional information to have their office give us [name] a call.

## 2017-12-16 ENCOUNTER — Ambulatory Visit: Payer: Medicaid Other | Admitting: Family Medicine

## 2018-01-19 ENCOUNTER — Ambulatory Visit: Payer: Medicaid Other | Admitting: General Surgery

## 2018-02-05 ENCOUNTER — Other Ambulatory Visit: Payer: Self-pay

## 2018-02-05 DIAGNOSIS — F411 Generalized anxiety disorder: Secondary | ICD-10-CM

## 2018-02-05 NOTE — Telephone Encounter (Signed)
Refill request for general medication: Alprazolam 0.5 mg   Last office visit: 09/15/2017  Last physical exam: 06/16/2017  Follow-ups on file. None indicated

## 2018-02-08 NOTE — Telephone Encounter (Signed)
Patient needs appointment in order to obtain further refills.  

## 2018-02-08 NOTE — Telephone Encounter (Signed)
Pt states he is no longer a pt here

## 2018-06-03 ENCOUNTER — Encounter: Payer: Self-pay | Admitting: *Deleted

## 2018-06-08 ENCOUNTER — Ambulatory Visit: Payer: Medicaid Other | Admitting: General Surgery

## 2018-07-06 ENCOUNTER — Encounter: Payer: Self-pay | Admitting: General Surgery

## 2018-10-05 ENCOUNTER — Ambulatory Visit: Payer: Medicaid Other | Admitting: General Surgery

## 2018-11-09 ENCOUNTER — Encounter: Payer: Self-pay | Admitting: *Deleted

## 2020-04-12 LAB — COLOGUARD: COLOGUARD: NEGATIVE

## 2023-08-28 LAB — COLOGUARD: COLOGUARD: NEGATIVE

## 2024-05-26 ENCOUNTER — Emergency Department

## 2024-05-26 ENCOUNTER — Encounter: Payer: Self-pay | Admitting: Emergency Medicine

## 2024-05-26 ENCOUNTER — Other Ambulatory Visit: Payer: Self-pay

## 2024-05-26 ENCOUNTER — Inpatient Hospital Stay
Admission: EM | Admit: 2024-05-26 | Discharge: 2024-06-02 | DRG: 641 | Disposition: A | Discharge status: Home / Self Care | Attending: Internal Medicine | Admitting: Internal Medicine

## 2024-05-26 DIAGNOSIS — Z87891 Personal history of nicotine dependence: Secondary | ICD-10-CM | POA: Diagnosis not present

## 2024-05-26 DIAGNOSIS — I10 Essential (primary) hypertension: Secondary | ICD-10-CM | POA: Diagnosis present

## 2024-05-26 DIAGNOSIS — E785 Hyperlipidemia, unspecified: Secondary | ICD-10-CM | POA: Diagnosis present

## 2024-05-26 DIAGNOSIS — R55 Syncope and collapse: Secondary | ICD-10-CM

## 2024-05-26 DIAGNOSIS — E871 Hypo-osmolality and hyponatremia: Principal | ICD-10-CM | POA: Diagnosis present

## 2024-05-26 DIAGNOSIS — Z886 Allergy status to analgesic agent status: Secondary | ICD-10-CM | POA: Diagnosis not present

## 2024-05-26 DIAGNOSIS — J441 Chronic obstructive pulmonary disease with (acute) exacerbation: Secondary | ICD-10-CM | POA: Diagnosis present

## 2024-05-26 DIAGNOSIS — F411 Generalized anxiety disorder: Secondary | ICD-10-CM | POA: Diagnosis present

## 2024-05-26 DIAGNOSIS — I739 Peripheral vascular disease, unspecified: Secondary | ICD-10-CM | POA: Diagnosis present

## 2024-05-26 DIAGNOSIS — I6523 Occlusion and stenosis of bilateral carotid arteries: Secondary | ICD-10-CM | POA: Diagnosis present

## 2024-05-26 DIAGNOSIS — R062 Wheezing: Secondary | ICD-10-CM

## 2024-05-26 DIAGNOSIS — E782 Mixed hyperlipidemia: Secondary | ICD-10-CM | POA: Diagnosis not present

## 2024-05-26 DIAGNOSIS — J9801 Acute bronchospasm: Principal | ICD-10-CM

## 2024-05-26 DIAGNOSIS — I447 Left bundle-branch block, unspecified: Secondary | ICD-10-CM | POA: Diagnosis present

## 2024-05-26 DIAGNOSIS — I6522 Occlusion and stenosis of left carotid artery: Secondary | ICD-10-CM | POA: Diagnosis not present

## 2024-05-26 DIAGNOSIS — R42 Dizziness and giddiness: Secondary | ICD-10-CM

## 2024-05-26 DIAGNOSIS — G4700 Insomnia, unspecified: Secondary | ICD-10-CM | POA: Diagnosis present

## 2024-05-26 DIAGNOSIS — Z8249 Family history of ischemic heart disease and other diseases of the circulatory system: Secondary | ICD-10-CM | POA: Diagnosis not present

## 2024-05-26 DIAGNOSIS — J449 Chronic obstructive pulmonary disease, unspecified: Secondary | ICD-10-CM

## 2024-05-26 DIAGNOSIS — R0602 Shortness of breath: Secondary | ICD-10-CM | POA: Insufficient documentation

## 2024-05-26 LAB — BASIC METABOLIC PANEL WITH GFR
Anion gap: 11 (ref 5–15)
BUN: 11 mg/dL (ref 8–23)
CO2: 21 mmol/L — ABNORMAL LOW (ref 22–32)
Calcium: 9.3 mg/dL (ref 8.9–10.3)
Chloride: 90 mmol/L — ABNORMAL LOW (ref 98–111)
Creatinine, Ser: 0.87 mg/dL (ref 0.61–1.24)
GFR, Estimated: >60 mL/min (ref >60)
Glucose, Bld: 101 mg/dL — ABNORMAL HIGH (ref 70–99)
Potassium: 4.8 mmol/L (ref 3.5–5.1)
Sodium: 122 mmol/L — ABNORMAL LOW (ref 135–145)

## 2024-05-26 LAB — CBC
HCT: 42.4 % (ref 39.0–52.0)
Hemoglobin: 14.6 g/dL (ref 13.0–17.0)
MCH: 28.2 pg (ref 26.0–34.0)
MCHC: 34.4 g/dL (ref 30.0–36.0)
MCV: 82 fL (ref 80.0–100.0)
Platelets: 235 K/uL (ref 150–400)
RBC: 5.17 MIL/uL (ref 4.22–5.81)
RDW: 13.4 % (ref 11.5–15.5)
WBC: 9.1 K/uL (ref 4.0–10.5)
nRBC: 0 % (ref 0.0–0.2)

## 2024-05-26 MED ORDER — CARVEDILOL 6.25 MG PO TABS
6.2500 mg | ORAL_TABLET | Freq: Every day | ORAL | Status: DC
  Administered 2024-05-27 – 2024-05-29 (×3): 6.25 mg via ORAL
  Filled 2024-05-26 (×3): qty 1

## 2024-05-26 MED ORDER — IPRATROPIUM-ALBUTEROL 0.5-2.5 (3) MG/3ML IN SOLN
3.0000 mL | Freq: Once | RESPIRATORY_TRACT | Status: AC
  Administered 2024-05-26: 3 mL via RESPIRATORY_TRACT
  Filled 2024-05-26: qty 3

## 2024-05-26 MED ORDER — ONDANSETRON HCL 4 MG/2ML IJ SOLN: 4.0000 mg | Freq: Four times a day (QID) | INTRAMUSCULAR | Status: AC | PRN

## 2024-05-26 MED ORDER — ACETAMINOPHEN 650 MG RE SUPP
650.0000 mg | Freq: Four times a day (QID) | RECTAL | Status: AC | PRN
Start: 2024-05-26 — End: 2024-05-31

## 2024-05-26 MED ORDER — METHYLPREDNISOLONE SODIUM SUCC 40 MG IJ SOLR
40.0000 mg | Freq: Every day | INTRAMUSCULAR | Status: DC
  Administered 2024-05-27: 40 mg via INTRAVENOUS
  Filled 2024-05-26 (×2): qty 1

## 2024-05-26 MED ORDER — ORAL CARE MOUTH RINSE: 15.0000 mL | OROMUCOSAL | Status: DC | PRN

## 2024-05-26 MED ORDER — SODIUM CHLORIDE 0.9 % IV BOLUS
500.0000 mL | Freq: Once | INTRAVENOUS | Status: AC
  Administered 2024-05-26: 500 mL via INTRAVENOUS

## 2024-05-26 MED ORDER — HYDRALAZINE HCL 20 MG/ML IJ SOLN: 5.0000 mg | Freq: Four times a day (QID) | INTRAMUSCULAR | Status: DC | PRN

## 2024-05-26 MED ORDER — METHYLPREDNISOLONE SODIUM SUCC 125 MG IJ SOLR
125.0000 mg | Freq: Once | INTRAMUSCULAR | Status: AC
  Administered 2024-05-26: 125 mg via INTRAVENOUS
  Filled 2024-05-26: qty 2

## 2024-05-26 MED ORDER — ACETAMINOPHEN 325 MG PO TABS
650.0000 mg | ORAL_TABLET | Freq: Four times a day (QID) | ORAL | Status: AC | PRN
Start: 2024-05-26 — End: 2024-05-31
  Administered 2024-05-26 – 2024-05-28 (×4): 650 mg via ORAL
  Filled 2024-05-26 (×4): qty 2

## 2024-05-26 MED ORDER — CARVEDILOL 6.25 MG PO TABS: 6.2500 mg | ORAL_TABLET | Freq: Two times a day (BID) | ORAL | Status: DC

## 2024-05-26 MED ORDER — ALPRAZOLAM 0.5 MG PO TABS
0.5000 mg | ORAL_TABLET | Freq: Three times a day (TID) | ORAL | Status: DC | PRN
  Administered 2024-05-26 – 2024-06-01 (×8): 0.5 mg via ORAL
  Filled 2024-05-26 (×9): qty 1

## 2024-05-26 MED ORDER — ONDANSETRON HCL 4 MG PO TABS: 4.0000 mg | ORAL_TABLET | Freq: Four times a day (QID) | ORAL | Status: AC | PRN

## 2024-05-26 MED ORDER — CARVEDILOL 12.5 MG PO TABS
12.5000 mg | ORAL_TABLET | Freq: Every day | ORAL | Status: DC
  Administered 2024-05-27 – 2024-05-30 (×4): 12.5 mg via ORAL
  Filled 2024-05-26 (×4): qty 1

## 2024-05-26 MED ORDER — IPRATROPIUM-ALBUTEROL 0.5-2.5 (3) MG/3ML IN SOLN
3.0000 mL | Freq: Four times a day (QID) | RESPIRATORY_TRACT | Status: AC
  Administered 2024-05-26 – 2024-05-27 (×3): 3 mL via RESPIRATORY_TRACT
  Filled 2024-05-26 (×3): qty 3

## 2024-05-26 MED ORDER — HEPARIN SODIUM (PORCINE) 5000 UNIT/ML IJ SOLN
5000.0000 [IU] | Freq: Three times a day (TID) | INTRAMUSCULAR | Status: DC
  Administered 2024-05-26 – 2024-06-02 (×20): 5000 [IU] via SUBCUTANEOUS
  Filled 2024-05-26 (×20): qty 1

## 2024-05-26 NOTE — Assessment & Plan Note (Addendum)
 Sodium down to 122 today.  Will start salt tablets and continue to monitor closely.  Patient is mentating fine.

## 2024-05-26 NOTE — ED Provider Notes (Signed)
 [facility] Provider Note    Event Date/Time   First MD Initiated Contact with Patient 05/26/24 1612     (approximate)   History   Shortness of Breath   HPI  James [name] is a 68 y.o. male with history of left bundle branch block, wheezing but no formal diagnosis of COPD presents with complaints of shortness of breath.  He reports this been ongoing for about 2 weeks.  He has been using an inhaler with only little improvement.  No significant cough or fevers.  Also reports some dizziness which is new for him.  No focal deficits     Physical Exam   Triage Vital Signs: ED Triage Vitals  Encounter Vitals Group     BP 05/26/24 1443 (!) 168/89     Girls Systolic BP Percentile --      Girls Diastolic BP Percentile --      Boys Systolic BP Percentile --      Boys Diastolic BP Percentile --      Pulse Rate 05/26/24 1443 88     Resp 05/26/24 1443 (!) 22     Temp 05/26/24 1443 99 F (37.2 C)     Temp Source 05/26/24 1443 Oral     SpO2 05/26/24 1443 98 %     Weight 05/26/24 1444 93 kg (205 lb)     Height 05/26/24 1444 1.702 m (5' 7)     Head Circumference --      Peak Flow --      Pain Score 05/26/24 1443 0     Pain Loc --      Pain Education --      Exclude from Growth Chart --     Most recent vital signs: Vitals:   05/26/24 1443  BP: (!) 168/89  Pulse: 88  Resp: (!) 22  Temp: 99 F (37.2 C)  SpO2: 98%     General: Awake, no distress.  CV:  Good peripheral perfusion.  Resp:  Mild tachypnea, scattered wheezing, moderate airflow Abd:  No distention.  Other:  Normal range of motion of all extremities   ED Results / Procedures / Treatments   Labs (all labs ordered are listed, but only abnormal results are displayed) Labs Reviewed  BASIC METABOLIC PANEL WITH GFR - Abnormal; Notable for the following components:      Result Value   Sodium 122 (*)    Chloride 90 (*)    CO2 21 (*)    Glucose, Bld 101 (*)    All other components  within normal limits  CBC     EKG  ED ECG REPORT I, Lamar Price, the attending physician, personally viewed and interpreted this ECG.  Date: 05/26/2024  Rhythm: normal sinus rhythm QRS Axis: normal Intervals: Abnormal ST/T Wave abnormalities: normal Narrative Interpretation: Bundle-branch block    RADIOLOGY Chest x-ray without acute abnormality    PROCEDURES:  Critical Care performed: yes  CRITICAL CARE Performed by: Lamar Price   Total critical care time: 30 minutes  Critical care time was exclusive of separately billable procedures and treating other patients.  Critical care was necessary to treat or prevent imminent or life-threatening deterioration.  Critical care was time spent personally by me on the following activities: development of treatment plan with patient and/or surrogate as well as nursing, discussions with consultants, evaluation of patient's response to treatment, examination of patient, obtaining history from patient or surrogate, ordering and performing treatments and interventions, ordering and review of  laboratory studies, ordering and review of radiographic studies, pulse oximetry and re-evaluation of patient's condition.   Procedures   MEDICATIONS ORDERED IN ED: Medications  ipratropium-albuterol  (DUONEB) 0.5-2.5 (3) MG/3ML nebulizer solution 3 mL (has no administration in time range)  ipratropium-albuterol  (DUONEB) 0.5-2.5 (3) MG/3ML nebulizer solution 3 mL (has no administration in time range)  methylPREDNISolone  sodium succinate (SOLU-MEDROL ) 125 mg/2 mL injection 125 mg (has no administration in time range)  sodium chloride  0.9 % bolus 500 mL (has no administration in time range)     IMPRESSION / MDM / ASSESSMENT AND PLAN / ED COURSE  I reviewed the triage vital signs and the nursing notes. Patient's presentation is most consistent with acute presentation with potential threat to life or bodily function.  Patient presents with  shortness of breath as detailed above, does have a history of smoking heavily in the past but no recent tobacco usage.  No fevers, no significant cough.  Suspicious for bronchospasm, undiagnosed COPD versus pneumonia  Will treat with DuoNebs, Solu-Medrol , obtain chest x-ray, labs and reevaluate  Lab work is notable for sodium of 122, this could explain his dizziness that is described.  Last sodium on record was normal  Patient will require admission for hyponatremia, shortness of breath, will consult the hospitalist team.       FINAL CLINICAL IMPRESSION(S) / ED DIAGNOSES   Final diagnoses:  Bronchospasm  Acute hyponatremia     Rx / DC Orders   ED Discharge Orders     None        Note:  This document was prepared using Dragon voice recognition software and may include unintentional dictation errors.   Arlander Charleston, MD 05/26/24 [id number]

## 2024-05-26 NOTE — Hospital Course (Addendum)
 Mr. James [name] appears is a 68 year old male with no prior diagnosis of COPD presents emergency department for chief concerns of shortness of breath.  Vitals in the ED showed temperature of 99, respiration rate 22, heart rate 88, blood pressure 168/89, SpO2 98% on room air.  Serum sodium is 122, potassium 4.8, chloride 90, bicarb 21, BUN of 11, serum creatinine 0.87, EGFR greater than 60, nonfasting blood glucose 101, WBC 9.1, hemoglobin 14.6, platelets of 235.  ED treatment: DuoNebs x 2, Solu-Medrol  125 mg IV x 2, sodium chloride  500 mL liter bolus.  7/26.  Sodium 122 today.  Patient not sleeping and feeling a little bit dizzy.  Appetites been poor lately but better since coming into the hospital.

## 2024-05-26 NOTE — Assessment & Plan Note (Deleted)
 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

## 2024-05-26 NOTE — Assessment & Plan Note (Deleted)
 Very mild and faint query emphysema Solu-Medrol  40 mg IV one-time dose ordered for 05/27/2024

## 2024-05-26 NOTE — H&P (Signed)
 History and Physical   James [name] FMW:[id number] DOB: 05-26-1956 DOA: 05/26/2024  PCP: James Channing SQUIBB, FNP  Patient coming from: Home  I have personally briefly reviewed patient's old medical records in [facility] EMR.  Chief Concern: Shortness of breath, weakness  HPI: Mr. James [name] appears is a 68 year old male with no prior diagnosis of COPD presents emergency department for chief concerns of shortness of breath.  Vitals in the ED showed temperature of 99, respiration rate 22, heart rate 88, blood pressure 168/89, SpO2 98% on room air.  Serum sodium is 122, potassium 4.8, chloride 90, bicarb 21, BUN of 11, serum creatinine 0.87, EGFR greater than 60, nonfasting blood glucose 101, WBC 9.1, hemoglobin 14.6, platelets of 235.  ED treatment: DuoNebs x 2, Solu-Medrol  125 mg IV x 2, sodium chloride  500 mL liter bolus. ----------------------------- At bedside, patient able to tell me his first and last name, age, location, current calendar year.  He reports shortness of breath over the last 2 to 3 days.  He reports 1 time he had a productive cough of yellow sputum.  He denies fever, chills, chest pain, dysuria, hematuria, diarrhea, blood in stool, changes to swelling of his lower extremities, weight changes.  He has tried home inhalers with minimal relief.  This prompted him to come to the emergency department for further evaluation.  He denies prior diagnosis of emphysema or COPD.  He reports his diet has been normal.  He denies changes to urination and salt intake.  He endorses that the breathing treatments and steroids in the ED has improved his symptoms significantly.  Social history: He lives at home.  He denies tobacco, EtOH, recreational drug use.  He is retired.  He is a former tobacco user quitting in 2000.  At the peak he was smoking 1 pack/day.  ROS: Constitutional: no weight change, no fever ENT/Mouth: no sore throat, no rhinorrhea Eyes: no eye pain, no vision  changes Cardiovascular: no chest pain, + dyspnea,  no edema, no palpitations Respiratory: + cough, no sputum, no wheezing Gastrointestinal: no nausea, no vomiting, no diarrhea, no constipation Genitourinary: no urinary incontinence, no dysuria, no hematuria Musculoskeletal: no arthralgias, no myalgias Skin: no skin lesions, no pruritus, Neuro: + weakness, no loss of consciousness, no syncope Psych: no anxiety, no depression, no decrease appetite Heme/Lymph: no bruising, no bleeding  ED Course: Discussed with EDP, patient requiring hospitalization for chief concerns of hyponatremia.  Assessment/Plan  Principal Problem:   Hyponatremia Active Problems:   Hypertension   Generalized anxiety disorder   Wheezing   Hyperlipidemia   Shortness of breath   Assessment and Plan:  * Hyponatremia Etiology workup in progress Anticipate correction of 6-8 in 24 hours, no more than 10 Check urine sodium, urine osmolality, serum osmolality Status post sodium chloride  500 mL liter bolus per EDP Recheck BMP in a.m.  Shortness of breath Query COPD/emphysema, not previously diagnosed DuoNebs scheduled, 3 doses ordered on admission Solu-Medrol  40 mg ordered for 05/27/2024  Hyperlipidemia Patient is not taking home statin medications  Wheezing Very mild and faint query emphysema Solu-Medrol  40 mg IV one-time dose ordered for 05/27/2024  Generalized anxiety disorder Home alprazolam  0.5 mg 3 times daily as needed for anxiety resumed PDMP reviewed  Hypertension Home carvedilol  6.25 mg p.o. twice daily with meals resumed Hydralazine  5 mg IV every 6 hours as needed for SBP greater 165, 5 days ordered  Chart reviewed.   DVT prophylaxis: Heparin  Code Status: Full code Diet: Regular diet Family Communication:  A phone call was offered, patient declined stating that he can update his cousin on his own Disposition Plan: Pending clinical course Consults called: None at this time Admission  status: Telemetry medical, inpatient  Past Medical History:  Diagnosis Date   Anxiety    history of   Depression    history of    Eyes sensitive to light    due to side effect of manufactured version of Gabapentin    Family history of colonic polyps    Hand pain, right    History of palpitations    intermittent; patient states heart skips a beat at times   Hyperlipidemia    Hypertension 2007   Insomnia    Shoulder injury 1976   Special screening for malignant neoplasms, colon    Spinal stenosis of lumbar region    Past Surgical History:  Procedure Laterality Date   CARDIAC CATHETERIZATION  2007   COLONOSCOPY  2013   KNEE SURGERY Left 1979   POLYPECTOMY  2013   TONSILLECTOMY  1964   Social History:  reports that he has quit smoking. He has a 25 pack-year smoking history. He has never used smokeless tobacco. He reports that he does not drink alcohol and does not use drugs.  Allergies  Allergen Reactions   Naprosyn [Naproxen] Itching   Family History  Problem Relation Age of Onset   Colon cancer Father    Cancer Father        colon   Transient ischemic attack Father        history of   Arthritis Mother    Heart failure Mother    Hypertension Mother    Kidney failure Mother    Family history: Family history reviewed and not pertinent.  Prior to Admission medications   Medication Sig Start Date End Date Taking? Authorizing Provider  albuterol  (PROVENTIL  HFA;VENTOLIN  HFA) 108 (90 Base) MCG/ACT inhaler Inhale 2 puffs into the lungs every 8 (eight) hours as needed for wheezing or shortness of breath. 08/26/16  Yes Maree Leni Edyth DELENA, MD  albuterol  (VENTOLIN  HFA) 108 (90 Base) MCG/ACT inhaler Inhale 2 puffs every 6 (six) hours as needed into the lungs for wheezing or shortness of breath. 09/15/17  Yes Maree Leni Edyth DELENA, MD  ALPRAZolam  (XANAX ) 0.5 MG tablet Take 1 tablet (0.5 mg total) 3 (three) times daily as needed by mouth for anxiety. 09/15/17  Yes Maree Leni Edyth DELENA, MD   carvedilol  (COREG ) 6.25 MG tablet Take 2 tabs of Carvedilol  6.25 mg in AM, 1 tab in PM 09/15/17  Yes Maree Leni Edyth A, MD  atorvastatin (LIPITOR) 20 MG tablet Take 20 mg by mouth daily. Patient not taking: Reported on 05/26/2024 01/19/24   [provider]  fluticasone  (FLONASE ) 50 MCG/ACT nasal spray Place 2 sprays into both nostrils daily. Patient not taking: Reported on 05/26/2024 03/03/17   Maree Leni Edyth DELENA, MD  Vitamin D , Ergocalciferol , (DRISDOL ) 50000 units CAPS capsule Take 1 capsule (50,000 Units total) once a week by mouth. For 12 weeks Patient not taking: Reported on 05/26/2024 09/21/17   Maree Leni Edyth DELENA, MD   Physical Exam: Vitals:   05/26/24 1639 05/26/24 1812 05/26/24 1815 05/26/24 1827  BP:  (!) 190/84 (!) 173/75 (!) 172/77  Pulse:  83  85  Resp: 18 18  16   Temp:  98.1 F (36.7 C)  97.9 F (36.6 C)  TempSrc:    Oral  SpO2:  99%  99%  Weight:  Height:       Constitutional: appears age-appropriate, NAD, calm Eyes: PERRL, lids and conjunctivae normal ENMT: Mucous membranes are moist. Posterior pharynx clear of any exudate or lesions. Age-appropriate dentition. Hearing appropriate Neck: normal, supple, no masses, no thyromegaly Respiratory: + Mild end expiratory wheezing on left upper and middle lobes, no crackles. Normal respiratory effort. No accessory muscle use.  Cardiovascular: Regular rate and rhythm, no murmurs / rubs / gallops. No extremity edema. 2+ pedal pulses. No carotid bruits.  Abdomen: no tenderness, no masses palpated, no hepatosplenomegaly. Bowel sounds positive.  Musculoskeletal: no clubbing / cyanosis. No joint deformity upper and lower extremities. Good ROM, no contractures, no atrophy. Normal muscle tone.  Skin: no rashes, lesions, ulcers. No induration Neurologic: Sensation intact. Strength 5/5 in all 4.  Psychiatric: Normal judgment and insight. Alert and oriented x 3. Normal mood.   EKG: independently reviewed, showing sinus rhythm with  rate of 86, QTc 449  Chest x-ray on Admission: I personally reviewed and I agree with radiologist reading as below.  DG Chest 2 View Result Date: 05/26/2024 CLINICAL DATA:  Shortness of breath.  Dizziness and chest tightness. EXAM: CHEST - 2 VIEW COMPARISON:  10/09/2015 FINDINGS: The cardiomediastinal contours are normal. Mild hyperinflation with chronic bronchial thickening. Pulmonary vasculature is normal. No consolidation, pleural effusion, or pneumothorax. No acute osseous abnormalities are seen. IMPRESSION: Mild hyperinflation and chronic bronchial thickening. Electronically Signed   By: Andrea Gasman M.D.   On: 05/26/2024 15:54   Labs on Admission: I have personally reviewed following labs  CBC: Recent Labs  Lab 05/26/24 1448  WBC 9.1  HGB 14.6  HCT 42.4  MCV 82.0  PLT 235   Basic Metabolic Panel: Recent Labs  Lab 05/26/24 1448  NA 122*  K 4.8  CL 90*  CO2 21*  GLUCOSE 101*  BUN 11  CREATININE 0.87  CALCIUM 9.3   GFR: Estimated Creatinine Clearance: 87.2 mL/min (by C-G formula based on SCr of 0.87 mg/dL).  Urine analysis:    Component Value Date/Time   COLORURINE Straw 05/26/2012 0608   APPEARANCEUR Clear 05/26/2012 0608   LABSPEC 1.004 05/26/2012 0608   PHURINE 6.0 05/26/2012 0608   GLUCOSEU Negative 05/26/2012 0608   HGBUR Negative 05/26/2012 0608   BILIRUBINUR Negative 05/26/2012 0608   KETONESUR Trace 05/26/2012 0608   PROTEINUR Negative 05/26/2012 0608   NITRITE Negative 05/26/2012 0608   LEUKOCYTESUR Negative 05/26/2012 0608   This document was prepared using Dragon Voice Recognition software and may include unintentional dictation errors.  Dr. Sherre [facility]  If 7PM-7AM, please contact overnight-coverage provider If 7AM-7PM, please contact day attending provider [email or web address]  05/26/2024, 7:20 PM

## 2024-05-26 NOTE — Assessment & Plan Note (Addendum)
 Home alprazolam  0.5 mg 3 times daily as needed for anxiety resumed.  Started on trazodone  at night for insomnia

## 2024-05-26 NOTE — Assessment & Plan Note (Addendum)
 Patient on Coreg 

## 2024-05-26 NOTE — ED Triage Notes (Signed)
 Patient to ED via POV for SOB. Ongoing for a few weeks. States he has been using his inhaler multiple times a day for same. States he has been dizzy and having blurred vision.

## 2024-05-26 NOTE — Assessment & Plan Note (Deleted)
 Patient is not taking home statin medications

## 2024-05-27 DIAGNOSIS — F411 Generalized anxiety disorder: Secondary | ICD-10-CM | POA: Diagnosis not present

## 2024-05-27 DIAGNOSIS — E782 Mixed hyperlipidemia: Secondary | ICD-10-CM | POA: Diagnosis not present

## 2024-05-27 DIAGNOSIS — J449 Chronic obstructive pulmonary disease, unspecified: Secondary | ICD-10-CM

## 2024-05-27 DIAGNOSIS — J441 Chronic obstructive pulmonary disease with (acute) exacerbation: Secondary | ICD-10-CM

## 2024-05-27 DIAGNOSIS — E871 Hypo-osmolality and hyponatremia: Secondary | ICD-10-CM | POA: Diagnosis not present

## 2024-05-27 LAB — BASIC METABOLIC PANEL WITH GFR
Anion gap: 8 (ref 5–15)
BUN: 14 mg/dL (ref 8–23)
CO2: 22 mmol/L (ref 22–32)
Calcium: 8.9 mg/dL (ref 8.9–10.3)
Chloride: 94 mmol/L — ABNORMAL LOW (ref 98–111)
Creatinine, Ser: 0.78 mg/dL (ref 0.61–1.24)
GFR, Estimated: >60 mL/min (ref >60)
Glucose, Bld: 128 mg/dL — ABNORMAL HIGH (ref 70–99)
Potassium: 4.2 mmol/L (ref 3.5–5.1)
Sodium: 124 mmol/L — ABNORMAL LOW (ref 135–145)

## 2024-05-27 LAB — CBC
HCT: 41 % (ref 39.0–52.0)
Hemoglobin: 14.3 g/dL (ref 13.0–17.0)
MCH: 28.4 pg (ref 26.0–34.0)
MCHC: 34.9 g/dL (ref 30.0–36.0)
MCV: 81.5 fL (ref 80.0–100.0)
Platelets: 204 K/uL (ref 150–400)
RBC: 5.03 MIL/uL (ref 4.22–5.81)
RDW: 13.2 % (ref 11.5–15.5)
WBC: 6.2 K/uL (ref 4.0–10.5)
nRBC: 0 % (ref 0.0–0.2)

## 2024-05-27 LAB — SODIUM, URINE, RANDOM: Sodium, Ur: 39 mmol/L

## 2024-05-27 LAB — BRAIN NATRIURETIC PEPTIDE: B Natriuretic Peptide: 130 pg/mL — ABNORMAL HIGH (ref 0.0–100.0)

## 2024-05-27 LAB — OSMOLALITY: Osmolality: 257 mosm/kg — ABNORMAL LOW (ref 275–295)

## 2024-05-27 LAB — OSMOLALITY, URINE: Osmolality, Ur: 344 mosm/kg (ref 300–900)

## 2024-05-27 MED ORDER — PREDNISONE 20 MG PO TABS
20.0000 mg | ORAL_TABLET | Freq: Every day | ORAL | Status: DC
  Administered 2024-05-28 – 2024-05-31 (×4): 20 mg via ORAL
  Filled 2024-05-27 (×4): qty 1

## 2024-05-27 MED ORDER — FLUTICASONE FUROATE-VILANTEROL 200-25 MCG/ACT IN AEPB
1.0000 | INHALATION_SPRAY | Freq: Every day | RESPIRATORY_TRACT | Status: DC
  Administered 2024-05-27 – 2024-06-02 (×7): 1 via RESPIRATORY_TRACT
  Filled 2024-05-27: qty 28

## 2024-05-27 MED ORDER — IBUPROFEN 400 MG PO TABS
400.0000 mg | ORAL_TABLET | Freq: Three times a day (TID) | ORAL | Status: AC
  Administered 2024-05-27 (×3): 400 mg via ORAL
  Filled 2024-05-27 (×3): qty 1

## 2024-05-27 MED ORDER — HYDRALAZINE HCL 20 MG/ML IJ SOLN
10.0000 mg | Freq: Four times a day (QID) | INTRAMUSCULAR | Status: AC | PRN
  Administered 2024-05-28: 10 mg via INTRAVENOUS
  Filled 2024-05-27: qty 1

## 2024-05-27 MED ORDER — TRAMADOL HCL 50 MG PO TABS
50.0000 mg | ORAL_TABLET | Freq: Once | ORAL | Status: AC
  Administered 2024-05-27: 50 mg via ORAL
  Filled 2024-05-27: qty 1

## 2024-05-27 MED ORDER — METHOCARBAMOL 500 MG PO TABS
500.0000 mg | ORAL_TABLET | Freq: Once | ORAL | Status: AC
  Administered 2024-05-27: 500 mg via ORAL
  Filled 2024-05-27: qty 1

## 2024-05-27 MED ORDER — IBUPROFEN 400 MG PO TABS
600.0000 mg | ORAL_TABLET | Freq: Once | ORAL | Status: AC
  Administered 2024-05-27: 600 mg via ORAL
  Filled 2024-05-27: qty 2

## 2024-05-27 MED ORDER — SODIUM CHLORIDE 0.9 % IV SOLN: INTRAVENOUS | Status: DC

## 2024-05-27 MED ORDER — ALBUTEROL SULFATE (2.5 MG/3ML) 0.083% IN NEBU: 2.5000 mg | INHALATION_SOLUTION | RESPIRATORY_TRACT | Status: DC | PRN

## 2024-05-27 MED ORDER — FLUTICASONE PROPIONATE 50 MCG/ACT NA SUSP
2.0000 | Freq: Every day | NASAL | Status: DC
  Filled 2024-05-27: qty 16

## 2024-05-27 NOTE — Progress Notes (Addendum)
 [facility]  - [place] at [facility]   PATIENT NAME: James [name]    MR#:  [id number]  DATE OF BIRTH:  [birth year]  SUBJECTIVE:  patient seen earlier. Sitting up in the chair. It decent breakfast. Complains of back pain. Breathing improved. Lives at home by himself. Ambulatory by himself and independent with driving. Poor appetite for last few days unable say by    VITALS:  Blood pressure (!) 162/73, pulse 83, temperature 97.9 F (36.6 C), resp. rate 16, height 5' 7 (1.702 m), weight 93 kg, SpO2 97%.  PHYSICAL EXAMINATION:   GENERAL:  68 y.o.-year-old patient with no acute distress. obese LUNGS: Normal breath sounds bilaterally, no wheezing CARDIOVASCULAR: S1, S2 normal. No murmur   ABDOMEN: Soft, nontender, nondistended. Bowel sounds present.  EXTREMITIES: No  edema b/l.    NEUROLOGIC: nonfocal  patient is alert and awake SKIN: No obvious rash, lesion, or ulcer.   LABORATORY PANEL:  CBC Recent Labs  Lab 05/27/24 0502  WBC 6.2  HGB 14.3  HCT 41.0  PLT 204    Chemistries  Recent Labs  Lab 05/27/24 0502  NA 124*  K 4.2  CL 94*  CO2 22  GLUCOSE 128*  BUN 14  CREATININE 0.78  CALCIUM 8.9   Cardiac Enzymes No results for input(s): TROPONINI in the last 168 hours. RADIOLOGY:  DG Chest 2 View Result Date: 05/26/2024 CLINICAL DATA:  Shortness of breath.  Dizziness and chest tightness. EXAM: CHEST - 2 VIEW COMPARISON:  10/09/2015 FINDINGS: The cardiomediastinal contours are normal. Mild hyperinflation with chronic bronchial thickening. Pulmonary vasculature is normal. No consolidation, pleural effusion, or pneumothorax. No acute osseous abnormalities are seen. IMPRESSION: Mild hyperinflation and chronic bronchial thickening. Electronically Signed   By: James [name] M.D.   On: 05/26/2024 15:54    Assessment and Plan  James [name] is a 68 y.o. male with history of left bundle branch block, wheezing but no formal diagnosis of COPD presents with  complaints of shortness of breath.  He reports this been ongoing for about 2 weeks.  He has been using an inhaler with only little improvement.  No significant cough or fevers.  Also reports some dizziness which is new for him.  No focal deficits   Patient was found to have sodium of 122. He started on IV fluids.  Hyponatremia -- suspect poor PO intake. -- Patient has had low sodium in 2013. -- denies any alcohol use or any PO new meds -- continue IV fluids -- came in with sodium of 122-- 124-- check metabolic panel tomorrow  COPD with acute mild exacerbation -- feeling better with steroids and nebulizer -- change to oral steroid tomorrow --check BNP -- patient will benefit from outpatient follow-up pulmonary  Generalized anxiety -- PRN Xanax   Hypertension resume home meds  If remains stable and sodium continues to improve patient can discharge tomorrow. He is agreeable.  Procedures: Family communication : none Consults : none CODE STATUS: full DVT Prophylaxis : heparin  Level of care: Telemetry Medical Status is: Inpatient Remains inpatient appropriate because: Hyponatremia    TOTAL TIME TAKING CARE OF THIS PATIENT: 35 minutes.  >50% time spent on counselling and coordination of care  Note: This dictation was prepared with Dragon dictation along with smaller phrase technology. Any transcriptional errors that result from this process are unintentional.  James [name] M.D    [facility]   CC: Primary care physician; James Channing SQUIBB, FNP

## 2024-05-27 NOTE — Plan of Care (Signed)
  Problem: Education: Goal: Knowledge of General Education information will improve Description: Including pain rating scale, medication(s)/side effects and non-pharmacologic comfort measures Outcome: Progressing   Problem: Health Behavior/Discharge Planning: Goal: Ability to manage health-related needs will improve Outcome: Progressing   Problem: Activity: Goal: Risk for activity intolerance will decrease Outcome: Progressing   Problem: Nutrition: Goal: Adequate nutrition will be maintained Outcome: Progressing   Problem: Coping: Goal: Level of anxiety will decrease Outcome: Not Progressing   

## 2024-05-27 NOTE — Significant Event (Signed)
       CROSS COVER NOTE  NAME: LACOREY BRUSCA MRN: [id number] DOB : 1955/12/03 ATTENDING PHYSICIAN: Tobie Calix, MD    Date of Service   05/27/2024   HPI/Events of Note   Nurse reports ongoing unrelieved back pain. Not on chronic pain meds at home. 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 Pain improves with standing, worsens with sitting or lying in beds here. Has special bed at home Tramadol 50 mg x 1 dose.        Erminio LITTIE Cone NP [facility] Cross Cover 7pm-7am - check amion for availability Pager [phone]

## 2024-05-28 DIAGNOSIS — E871 Hypo-osmolality and hyponatremia: Secondary | ICD-10-CM | POA: Diagnosis not present

## 2024-05-28 DIAGNOSIS — I10 Essential (primary) hypertension: Secondary | ICD-10-CM

## 2024-05-28 DIAGNOSIS — G4700 Insomnia, unspecified: Secondary | ICD-10-CM | POA: Insufficient documentation

## 2024-05-28 DIAGNOSIS — J441 Chronic obstructive pulmonary disease with (acute) exacerbation: Secondary | ICD-10-CM | POA: Diagnosis not present

## 2024-05-28 DIAGNOSIS — F411 Generalized anxiety disorder: Secondary | ICD-10-CM | POA: Diagnosis not present

## 2024-05-28 LAB — BASIC METABOLIC PANEL WITH GFR
Anion gap: 6 (ref 5–15)
BUN: 14 mg/dL (ref 8–23)
CO2: 20 mmol/L — ABNORMAL LOW (ref 22–32)
Calcium: 8.7 mg/dL — ABNORMAL LOW (ref 8.9–10.3)
Chloride: 96 mmol/L — ABNORMAL LOW (ref 98–111)
Creatinine, Ser: 0.88 mg/dL (ref 0.61–1.24)
GFR, Estimated: >60 mL/min (ref >60)
Glucose, Bld: 98 mg/dL (ref 70–99)
Potassium: 3.7 mmol/L (ref 3.5–5.1)
Sodium: 122 mmol/L — ABNORMAL LOW (ref 135–145)

## 2024-05-28 MED ORDER — POLYETHYLENE GLYCOL 3350 17 G PO PACK
17.0000 g | PACK | Freq: Once | ORAL | Status: AC
  Administered 2024-05-28: 17 g via ORAL
  Filled 2024-05-28: qty 1

## 2024-05-28 MED ORDER — ALPRAZOLAM 0.5 MG PO TABS
1.0000 mg | ORAL_TABLET | Freq: Once | ORAL | Status: AC
  Administered 2024-05-28: 1 mg via ORAL
  Filled 2024-05-28: qty 2

## 2024-05-28 MED ORDER — TRAMADOL HCL 50 MG PO TABS
50.0000 mg | ORAL_TABLET | Freq: Three times a day (TID) | ORAL | Status: DC | PRN
  Administered 2024-05-28 – 2024-06-01 (×5): 50 mg via ORAL
  Filled 2024-05-28 (×8): qty 1

## 2024-05-28 MED ORDER — SODIUM CHLORIDE 1 G PO TABS
1.0000 g | ORAL_TABLET | Freq: Three times a day (TID) | ORAL | Status: DC
  Administered 2024-05-28 – 2024-06-02 (×17): 1 g via ORAL
  Filled 2024-05-28 (×17): qty 1

## 2024-05-28 MED ORDER — TRAZODONE HCL 50 MG PO TABS
50.0000 mg | ORAL_TABLET | Freq: Every day | ORAL | Status: DC
  Administered 2024-05-28 – 2024-06-01 (×5): 50 mg via ORAL
  Filled 2024-05-28 (×5): qty 1

## 2024-05-28 NOTE — Assessment & Plan Note (Signed)
 Trial of trazodone  at night

## 2024-05-28 NOTE — Plan of Care (Signed)

## 2024-05-28 NOTE — Assessment & Plan Note (Signed)
 Patient was given Solu-Medrol  and switched over to prednisone .  Continue inhalers

## 2024-05-28 NOTE — Plan of Care (Signed)
  Problem: Education: Goal: Knowledge of General Education information will improve Description: Including pain rating scale, medication(s)/side effects and non-pharmacologic comfort measures Outcome: Progressing   Problem: Health Behavior/Discharge Planning: Goal: Ability to manage health-related needs will improve Outcome: Progressing   Problem: Activity: Goal: Risk for activity intolerance will decrease Outcome: Progressing   Problem: Nutrition: Goal: Adequate nutrition will be maintained Outcome: Progressing   Problem: Coping: Goal: Level of anxiety will decrease Outcome: Not Progressing   

## 2024-05-28 NOTE — Care Management Important Message (Signed)
 Important Message  Patient Details  Name: James [name] MRN: [id number] Date of Birth: 1956/01/31   Important Message Given:  Yes - Medicare IM     [name] 05/28/2024, 1:37 PM

## 2024-05-28 NOTE — Progress Notes (Signed)
  Progress Note   Patient: James [name] FMW:[id number] DOB: 07/13/1956 DOA: 05/26/2024     2 DOS: the patient was seen and examined on 05/28/2024   Brief hospital course: James [name] appears is a 68 year old male with no prior diagnosis of COPD presents emergency department for chief concerns of shortness of breath.  Vitals in the ED showed temperature of 99, respiration rate 22, heart rate 88, blood pressure 168/89, SpO2 98% on room air.  Serum sodium is 122, potassium 4.8, chloride 90, bicarb 21, BUN of 11, serum creatinine 0.87, EGFR greater than 60, nonfasting blood glucose 101, WBC 9.1, hemoglobin 14.6, platelets of 235.  ED treatment: DuoNebs x 2, Solu-Medrol  125 mg IV x 2, sodium chloride  500 mL liter bolus.  7/26.  Sodium 122 today.  Patient not sleeping and feeling a little bit dizzy.  Appetites been poor lately but better since coming into the hospital.  Assessment and Plan: * Hyponatremia Sodium down to 122 today.  Will start salt tablets and continue to monitor closely.  Patient is mentating fine.  COPD with acute exacerbation (HCC) Patient was given Solu-Medrol  and switched over to prednisone .  Continue inhalers  Hypertension Patient on Coreg   Generalized anxiety disorder Home alprazolam  0.5 mg 3 times daily as needed for anxiety resumed.  Started on trazodone  at night for insomnia  Insomnia Trial of trazodone  at night        Subjective: Patient states he has not been sleeping lately and is feeling dizzy.  Sodium today down to 122.  Admitted with hyponatremia.  Physical Exam: Vitals:   05/27/24 2014 05/28/24 0230 05/28/24 0402 05/28/24 0835  BP: (!) 167/80 (!) 169/86 (!) 107/54 130/67  Pulse: 67 76 63 65  Resp: 20 20 18 16   Temp: 98.1 F (36.7 C) 97.8 F (36.6 C) 97.7 F (36.5 C) 97.7 F (36.5 C)  TempSrc: Oral Oral    SpO2: 97% 99% 95% 98%  Weight:      Height:       Physical Exam HENT:     Head: Normocephalic.  Eyes:     General: Lids are normal.      Conjunctiva/sclera: Conjunctivae normal.  Cardiovascular:     Rate and Rhythm: Normal rate and regular rhythm.     Heart sounds: Normal heart sounds, S1 normal and S2 normal.  Pulmonary:     Breath sounds: Examination of the right-lower field reveals decreased breath sounds. Examination of the left-lower field reveals decreased breath sounds. Decreased breath sounds present. No wheezing, rhonchi or rales.  Abdominal:     Palpations: Abdomen is soft.     Tenderness: There is no abdominal tenderness.  Musculoskeletal:     Right upper leg: No swelling.     Left upper leg: No swelling.  Skin:    General: Skin is warm.     Findings: No rash.  Neurological:     Mental Status: He is alert and oriented to person, place, and time.     Data Reviewed: Sodium 122, creatinine 0.88, CO2 20, hypoxic on 6.2, hemoglobin 14.3, platelet count 204  Disposition: Status is: Inpatient Remains inpatient appropriate because: Sodium still low at 122.  Will start salt tablets and fluid restriction.  Planned Discharge Destination: Home    Time spent: 28 minutes  Author: Charlie Patterson, MD 05/28/2024 2:08 PM  For on call review [email or web address].

## 2024-05-28 NOTE — Progress Notes (Signed)
 PT Cancellation Note  Patient Details Name: MAXXWELL EDGETT MRN: [id number] DOB: 1956-07-22   Cancelled Treatment:    Reason Eval/Treat Not Completed: Other (comment). Consult received and chart reviewed. Pt lying in bed and reports he has been ambulating around room with independence. Offered mobility, however politely declines at this time. Reports no falls history and indep at home. Reports concerns with R hand atrophy. Discussed HEP and ROM exercises. Pt encouraged to continue mobility efforts. At this time, no acute PT indicated. Recommend continue mobility with RN staff and mobility specialists. Signing off.   Dusten Ellinwood 05/28/2024, 2:15 PM Corean Dade, PT, DPT, GCS [phone]

## 2024-05-29 DIAGNOSIS — E871 Hypo-osmolality and hyponatremia: Secondary | ICD-10-CM | POA: Diagnosis not present

## 2024-05-29 LAB — BASIC METABOLIC PANEL WITH GFR
Anion gap: 9 (ref 5–15)
BUN: 16 mg/dL (ref 8–23)
CO2: 24 mmol/L (ref 22–32)
Calcium: 9.1 mg/dL (ref 8.9–10.3)
Chloride: 99 mmol/L (ref 98–111)
Creatinine, Ser: 0.99 mg/dL (ref 0.61–1.24)
GFR, Estimated: >60 mL/min (ref >60)
Glucose, Bld: 93 mg/dL (ref 70–99)
Potassium: 3.9 mmol/L (ref 3.5–5.1)
Sodium: 132 mmol/L — ABNORMAL LOW (ref 135–145)

## 2024-05-29 MED ORDER — MECLIZINE HCL 25 MG PO TABS
25.0000 mg | ORAL_TABLET | Freq: Three times a day (TID) | ORAL | Status: DC
  Administered 2024-05-29 – 2024-06-02 (×12): 25 mg via ORAL
  Filled 2024-05-29 (×13): qty 1

## 2024-05-29 NOTE — Progress Notes (Addendum)
  Progress Note   Patient: James [name] FMW:[id number] DOB: 02/07/1956 DOA: 05/26/2024     3 DOS: the patient was seen and examined on 05/29/2024   Brief hospital course: James [name] appears is a 68 year old male with no prior diagnosis of COPD presents emergency department for chief concerns of shortness of breath.   Vitals in the ED showed temperature of 99, respiration rate 22, heart rate 88, blood pressure 168/89, SpO2 98% on room air.   Serum sodium is 122, potassium 4.8, chloride 90, bicarb 21, BUN of 11, serum creatinine 0.87, EGFR greater than 60, nonfasting blood glucose 101, WBC 9.1, hemoglobin 14.6, platelets of 235.   ED treatment: DuoNebs x 2, Solu-Medrol  125 mg IV x 2, sodium chloride  500 mL liter bolus.   7/26.  Sodium 122 today.  Patient not sleeping and feeling a little bit dizzy.  Appetites been poor lately but better since coming into the hospital.  7/27 improved sodium levels.  Continues to complain of difficulty sleeping and feeling dizzy.   Assessment and Plan:  * Hyponatremia Improved sodium levels since starting salt tablets    COPD with acute exacerbation (HCC) Patient was given Solu-Medrol  and switched over to prednisone  to complete the 5-day course of therapy.  Continue inhalers   Hypertension Blood pressure is stable on Coreg    Generalized anxiety disorder Home alprazolam  0.5 mg 3 times daily as needed for anxiety resumed.  Started on trazodone  at night for insomnia with minimal improvement   Insomnia Trial of trazodone  at night   Dizziness No orthostatic blood pressure changes Symptom control     Subjective: Complains of dizziness at rest.  Denies having any tinnitus  Physical Exam: Vitals:   05/28/24 2014 05/29/24 0800 05/29/24 1100 05/29/24 1103  BP: (!) 145/71 (!) 158/91 (!) 155/68 135/79  Pulse: 78 68 64 69  Resp: 16 18    Temp: 98.2 F (36.8 C) 97.9 F (36.6 C)    TempSrc:  Axillary    SpO2: 96% 96%    Weight:      Height:        HENT:     Head: Normocephalic.  Eyes:     General: Lids are normal.     Conjunctiva/sclera: Conjunctivae normal.  Cardiovascular:     Rate and Rhythm: Normal rate and regular rhythm.     Heart sounds: Normal heart sounds, S1 normal and S2 normal.  Pulmonary:     Breath sounds: Bilateral air entry, no wheezing, rhonchi or rales.  Abdominal:     Palpations: Abdomen is soft.     Tenderness: There is no abdominal tenderness.  Musculoskeletal:     Right upper leg: No swelling.     Left upper leg: No swelling.  Skin:    General: Skin is warm.     Findings: No rash.  Neurological:     Mental Status: He is alert and oriented to person, place, and time.    Data Reviewed: Sodium 122 >>  132 Labs reviewed  Family Communication: Plan of care discussed with patient at the bedside.  He verbalizes understanding and agrees with the plan  Disposition: Status is: Inpatient Remains inpatient appropriate because: Symptom control  Planned Discharge Destination: Home    Time spent: 38 minutes  Author: Aimee Somerset, MD 05/29/2024 1:27 PM  For on call review [email or web address].

## 2024-05-29 NOTE — Plan of Care (Signed)

## 2024-05-30 DIAGNOSIS — E871 Hypo-osmolality and hyponatremia: Secondary | ICD-10-CM | POA: Diagnosis not present

## 2024-05-30 MED ORDER — CARVEDILOL 6.25 MG PO TABS
3.1250 mg | ORAL_TABLET | Freq: Every day | ORAL | Status: DC
  Administered 2024-06-01 – 2024-06-02 (×2): 3.125 mg via ORAL
  Filled 2024-05-30 (×3): qty 1

## 2024-05-30 MED ORDER — POLYETHYLENE GLYCOL 3350 17 G PO PACK
17.0000 g | PACK | Freq: Every day | ORAL | Status: DC
  Administered 2024-05-30 – 2024-06-02 (×3): 17 g via ORAL
  Filled 2024-05-30 (×4): qty 1

## 2024-05-30 MED ORDER — CARVEDILOL 6.25 MG PO TABS: 6.2500 mg | ORAL_TABLET | Freq: Every day | ORAL | Status: DC

## 2024-05-30 MED ORDER — CARVEDILOL 6.25 MG PO TABS
3.1250 mg | ORAL_TABLET | Freq: Every day | ORAL | Status: DC
  Administered 2024-05-30 – 2024-06-01 (×3): 3.125 mg via ORAL
  Filled 2024-05-30 (×3): qty 1

## 2024-05-30 NOTE — TOC CM/SW Note (Signed)
 Transition of Care [facility]) - Inpatient Brief Assessment   Patient Details  Name: James [name] MRN: [id number] Date of Birth: [birth year]  Transition of Care [facility]) CM/SW Contact:    Corean ONEIDA Haddock, RN Phone Number: 05/30/2024, 4:24 PM   Clinical Narrative:  Transition of Care [facility]) Screening Note   Patient Details  Name: James [name] Date of Birth: 03-07-1956   Transition of Care [facility]) CM/SW Contact:    Corean ONEIDA Haddock, RN Phone Number: 05/30/2024, 4:24 PM    Transition of Care Department [facility]) has reviewed patient and no TOC needs have been identified at this time.  If new patient transition needs arise, please place a TOC consult.     Transition of Care Asessment: Insurance and Status: Insurance coverage has been reviewed Patient has primary care physician: Yes     Prior/Current Home Services: No current home services Social Drivers of Health Review: SDOH reviewed no interventions necessary Readmission risk has been reviewed: Yes Transition of care needs: no transition of care needs at this time

## 2024-05-30 NOTE — Progress Notes (Signed)
 Progress Note   Patient: James [name] FMW:[id number] DOB: 03/04/1956 DOA: 05/26/2024     4 DOS: the patient was seen and examined on 05/30/2024   Brief hospital course:  Mr. Darivs appears is a 68 year old male with no prior diagnosis of COPD presents emergency department for chief concerns of shortness of breath.   Vitals in the ED showed temperature of 99, respiration rate 22, heart rate 88, blood pressure 168/89, SpO2 98% on room air.   Serum sodium is 122, potassium 4.8, chloride 90, bicarb 21, BUN of 11, serum creatinine 0.87, EGFR greater than 60, nonfasting blood glucose 101, WBC 9.1, hemoglobin 14.6, platelets of 235.   ED treatment: DuoNebs x 2, Solu-Medrol  125 mg IV x 2, sodium chloride  500 mL liter bolus.   7/26.  Sodium 122 today.  Patient not sleeping and feeling a little bit dizzy.  Appetites been poor lately but better since coming into the hospital.   7/27 improved sodium levels.  Continues to complain of difficulty sleeping and feeling dizzy.  7/28 Continues to complain of feeling dizzy at rest. Able to ambulate in the hall       Assessment and Plan:  * Hyponatremia Improved sodium levels since starting salt tablets Will monitor     COPD with acute exacerbation (HCC) Patient was given Solu-Medrol  and switched over to prednisone  to complete the 5-day course of therapy.   Continue inhalers PRN    Hypertension Blood pressure is stable on Coreg  We will decrease dose of carvedilol  to 3.125 mg BID due to side effects    Generalized anxiety disorder Home alprazolam  0.5 mg 3 times daily as needed for anxiety resumed.  Started on trazodone  at night for insomnia with minimal improvement    Insomnia Trial of trazodone  at night     Dizziness No orthostatic blood pressure changes Most likely medication induced as patient is on beta blockers which can cause bradycardia Will decrease dose of carvedilol  to 3.125 mg twice daily since patient's heart rate has been in  the low 60s Symptom control     Subjective: Complains of constipation  Physical Exam: Vitals:   05/30/24 0526 05/30/24 0844 05/30/24 1053 05/30/24 1056  BP: (!) 162/69 (!) 156/73 (!) 157/67 (!) 141/82  Pulse: (!) 56 64 62 61  Resp: 16 16 16    Temp: 97.8 F (36.6 C) 97.8 F (36.6 C)    TempSrc:      SpO2: 100% 100%    Weight:      Height:       HENT:     Head: Normocephalic.  Eyes:     General: Lids are normal.     Conjunctiva/sclera: Conjunctivae normal.  Cardiovascular:     Rate and Rhythm: Normal rate and regular rhythm.     Heart sounds: Normal heart sounds, S1 normal and S2 normal.  Pulmonary:     Breath sounds: Bilateral air entry, no wheezing, rhonchi or rales.  Abdominal:     Palpations: Abdomen is soft.     Tenderness: There is no abdominal tenderness.  Musculoskeletal:     Right upper leg: No swelling.     Left upper leg: No swelling.  Skin:    General: Skin is warm.     Findings: No rash.  Neurological:     Mental Status: He is alert and oriented to person, place, and time.     Data Reviewed: Sodium 132 Labs reviewed  Family Communication: Plan of care was discussed with patient in detail.  He verbalizes understanding and agrees with the plan.  Disposition: Status is: Inpatient Remains inpatient appropriate because: For possible discharge in a.m.  Planned Discharge Destination: Home    Time spent: 35  minutes  Author: Aimee Somerset, MD 05/30/2024 12:51 PM  For on call review [email or web address].

## 2024-05-31 ENCOUNTER — Inpatient Hospital Stay (HOSPITAL_COMMUNITY)
Admit: 2024-05-31 | Discharge: 2024-05-31 | Disposition: A | Discharge status: Home / Self Care | Attending: Physician Assistant | Admitting: Physician Assistant

## 2024-05-31 ENCOUNTER — Other Ambulatory Visit: Payer: Self-pay

## 2024-05-31 ENCOUNTER — Inpatient Hospital Stay

## 2024-05-31 ENCOUNTER — Inpatient Hospital Stay: Admit: 2024-05-31

## 2024-05-31 DIAGNOSIS — E871 Hypo-osmolality and hyponatremia: Secondary | ICD-10-CM | POA: Diagnosis not present

## 2024-05-31 DIAGNOSIS — I447 Left bundle-branch block, unspecified: Secondary | ICD-10-CM

## 2024-05-31 DIAGNOSIS — R0602 Shortness of breath: Secondary | ICD-10-CM

## 2024-05-31 DIAGNOSIS — R42 Dizziness and giddiness: Secondary | ICD-10-CM

## 2024-05-31 LAB — BASIC METABOLIC PANEL WITH GFR
Anion gap: 7 (ref 5–15)
BUN: 16 mg/dL (ref 8–23)
CO2: 25 mmol/L (ref 22–32)
Calcium: 8.9 mg/dL (ref 8.9–10.3)
Chloride: 101 mmol/L (ref 98–111)
Creatinine, Ser: 0.94 mg/dL (ref 0.61–1.24)
GFR, Estimated: >60 mL/min (ref >60)
Glucose, Bld: 85 mg/dL (ref 70–99)
Potassium: 4.2 mmol/L (ref 3.5–5.1)
Sodium: 133 mmol/L — ABNORMAL LOW (ref 135–145)

## 2024-05-31 LAB — MAGNESIUM: Magnesium: 2.4 mg/dL (ref 1.7–2.4)

## 2024-05-31 MED ORDER — AMLODIPINE BESYLATE 5 MG PO TABS
5.0000 mg | ORAL_TABLET | Freq: Every day | ORAL | Status: DC
  Administered 2024-05-31 – 2024-06-02 (×3): 5 mg via ORAL
  Filled 2024-05-31 (×3): qty 1

## 2024-05-31 NOTE — Plan of Care (Signed)

## 2024-05-31 NOTE — Progress Notes (Signed)
 Progress Note   Patient: James [name] [id number] DOB: 02-14-1956 DOA: 05/26/2024     5 DOS: the patient was seen and examined on 05/31/2024   Brief hospital course:  Mr. James [name] appears is a 68 year old male with no prior diagnosis of COPD presents emergency department for chief concerns of shortness of breath.   Vitals in the ED showed temperature of 99, respiration rate 22, heart rate 88, blood pressure 168/89, SpO2 98% on room air.   Serum sodium is 122, potassium 4.8, chloride 90, bicarb 21, BUN of 11, serum creatinine 0.87, EGFR greater than 60, nonfasting blood glucose 101, WBC 9.1, hemoglobin 14.6, platelets of 235.   ED treatment: DuoNebs x 2, Solu-Medrol  125 mg IV x 2, sodium chloride  500 mL liter bolus.   7/26.  Sodium 122 today.  Patient not sleeping and feeling a little bit dizzy.  Appetites been poor lately but better since coming into the hospital.   7/27 improved sodium levels.  Continues to complain of difficulty sleeping and feeling dizzy.   7/28 Continues to complain of feeling dizzy at rest. Able to ambulate in the hall  7/29 continues to complain of feeling dizzy despite a decrease in the dose of his carvedilol .  Negative orthostatic blood pressure changes.    Assessment and Plan:  * Hyponatremia Improved sodium levels since starting salt tablets Will monitor     COPD with acute exacerbation (HCC) Patient was given Solu-Medrol  and switched over to prednisone  to complete the 5-day course of therapy.   Continue inhalers PRN     Hypertension Will hold carvedilol  due to relative bradycardia Start patient on amlodipine  5 mg daily and uptitrate to optimize blood pressure control.      Generalized anxiety disorder Home alprazolam  0.5 mg 3 times daily as needed for anxiety resumed.  Started on trazodone  at night for insomnia with minimal improvement     Insomnia Trial of trazodone  at night     Dizziness No orthostatic blood pressure changes Most  likely medication induced as patient is on beta blockers which can cause bradycardia Hold carvedilol  to 3.125 mg twice daily since patient's heart rate has been in the low 50s Follow-up results of 2D echocardiogram and carotid Doppler Appreciate cardiology input.         Subjective: Patient is seen and examined at the bedside.  Continues to complain of feeling dizzy  Physical Exam: Vitals:   05/30/24 1644 05/30/24 2043 05/31/24 0450 05/31/24 0805  BP: (!) 155/73 (!) 144/70 (!) 160/70 (!) 160/78  Pulse: 71 67 (!) 56 (!) 57  Resp: 19 20 12 15   Temp: 98.2 F (36.8 C) 98.3 F (36.8 C) 97.6 F (36.4 C) 97.8 F (36.6 C)  TempSrc: Oral Oral Oral   SpO2: 100% 100% 99% 99%  Weight:      Height:       HENT:     Head: Normocephalic.  Eyes:     General: Lids are normal.     Conjunctiva/sclera: Conjunctivae normal.  Cardiovascular:     Rate and Rhythm: Normal rate and regular rhythm.     Heart sounds: Normal heart sounds, S1 normal and S2 normal.  Pulmonary:     Breath sounds: Bilateral air entry, no wheezing, rhonchi or rales.  Abdominal:     Palpations: Abdomen is soft.     Tenderness: There is no abdominal tenderness.  Musculoskeletal:     Right upper leg: No swelling.     Left upper leg: No swelling.  Skin:    General: Skin is warm.     Findings: No rash.  Neurological:     Mental Status: He is alert and oriented to person, place, and time.    Data Reviewed: Sodium 133 Labs reviewed  Family Communication:   Disposition: Status is: Inpatient Remains inpatient appropriate because: Needs further evaluation for dizziness  Planned Discharge Destination: Home    Time spent: 40  minutes  Author: Aimee Somerset, MD 05/31/2024 1:08 PM  For on call review [email or web address].

## 2024-05-31 NOTE — Consult Note (Signed)
 Cardiology Consultation   Patient ID: James [name] MRN: [id number]; DOB: 1955/11/28  Admit date: 05/26/2024 Date of Consult: 05/31/2024  PCP:  Donal Channing SQUIBB, FNP   [facility] Providers Cardiologist:  None        Patient Profile: James [name] is a 68 y.o. male with a hx of prior tobacco use, wheezing without formal COPD diagnosis, hypertension, hyperlipidemia, and LBBB who is being seen 05/31/2024 for the evaluation of dizziness at the request of Dr. Lanetta.  History of Present Illness: James [name].  Cardiac catheterization in 2007 which did not show any obstructive disease.  He had cardiac workup in 2017 for exertional dyspnea with echocardiogram showing low normal LV systolic function with EF 50 to 55%.  Pharmacological nuclear stress test showed no evidence of ischemia with normal EF.  Patient presented to [facility] on 7/24 with complaints of a 2-week history of shortness of breath, wheezing, and dizziness.  He also noted dry cough.  In the ED, BP 168/89, RR 22 with otherwise normal vital signs.  Pertinent labs include sodium 122, chloride 90.  Chest x-ray negative.  He has been treated with steroids and nebulizers for acute mild COPD exacerbation.  He was given IV fluids and started on salt tablets for hyponatremia with improvement of sodium to 133 today.  Cardiology was asked to consult for further evaluation of dizziness.  Orthostatic vital signs are negative.  At time of cardiology consult, patient reports improvements in shortness of breath.  He endorses ongoing episodes of lightheadedness.  These episodes are mostly associated with exertion but also occur at rest.  Patient endorses 1 episode of palpitations lasting a few seconds yesterday which was not associated with lightheadedness.  He denies chest pain, shortness of breath, lower extremity swelling, orthopnea, and PND.  He denies any history of syncope.  He reports being on carvedilol  for several years without adverse  effect.  He also reports getting an annual echocardiogram and carotid ultrasound with his PCP which have reportedly been normal.  The studies are unfortunately not available for review.   Past Medical History:  Diagnosis Date   Anxiety    history of   Depression    history of    Eyes sensitive to light    due to side effect of manufactured version of Gabapentin    Family history of colonic polyps    Hand pain, right    History of palpitations    intermittent; patient states heart skips a beat at times   Hyperlipidemia    Hypertension 2007   Insomnia    Shoulder injury 1976   Special screening for malignant neoplasms, colon    Spinal stenosis of lumbar region     Past Surgical History:  Procedure Laterality Date   CARDIAC CATHETERIZATION  2007   COLONOSCOPY  2013   KNEE SURGERY Left 1979   POLYPECTOMY  2013   TONSILLECTOMY  1964       Scheduled Meds:  amLODipine   5 mg Oral Daily   carvedilol   3.125 mg Oral QAC supper   carvedilol   3.125 mg Oral Q breakfast   fluticasone  furoate-vilanterol  1 puff Inhalation Daily   heparin   5,000 Units Subcutaneous Q8H   meclizine   25 mg Oral TID   polyethylene glycol  17 g Oral Daily   predniSONE   20 mg Oral Q breakfast   sodium chloride   1 g Oral TID WC   traZODone   50 mg Oral QHS   Continuous Infusions:  PRN Meds: acetaminophen  **OR** acetaminophen , albuterol , ALPRAZolam , hydrALAZINE , ondansetron  **OR** ondansetron  (ZOFRAN ) IV, mouth rinse, traMADol   Allergies:    Allergies  Allergen Reactions   Naprosyn [Naproxen] Itching    Social History:   Social History   Socioeconomic History   Marital status: Single    Spouse name: Not on file   Number of children: Not on file   Years of education: Not on file   Highest education level: Not on file  Occupational History   Not on file  Tobacco Use   Smoking status: Former    Current packs/day: 1.00    Average packs/day: 1 pack/day for 25.0 years (25.0 ttl pk-yrs)     Types: Cigarettes   Smokeless tobacco: Never  Vaping Use   Vaping status: Never Used  Substance and Sexual Activity   Alcohol use: No    Alcohol/week: 0.0 standard drinks of alcohol   Drug use: No   Sexual activity: Never  Other Topics Concern   Not on file  Social History Narrative   Not on file   Social Drivers of Health   Financial Resource Strain: Not on file  Food Insecurity: No Food Insecurity (05/26/2024)   Hunger Vital Sign    Worried About Running Out of Food in the Last Year: Never true    Ran Out of Food in the Last Year: Never true  Transportation Needs: No Transportation Needs (05/26/2024)   PRAPARE - Administrator, Civil Service (Medical): No    Lack of Transportation (Non-Medical): No  Physical Activity: Not on file  Stress: Not on file  Social Connections: Unknown (05/30/2024)   Social Connection and Isolation Panel    Frequency of Communication with Friends and Family: Not on file    Frequency of Social Gatherings with Friends and Family: Not on file    Attends Religious Services: Not on file    Active Member of Clubs or Organizations: Not on file    Attends Banker Meetings: Not on file    Marital Status: Never married  Intimate Partner Violence: Not At Risk (05/26/2024)   Humiliation, Afraid, Rape, and Kick questionnaire    Fear of Current or Ex-Partner: No    Emotionally Abused: No    Physically Abused: No    Sexually Abused: No    Family History:    Family History  Problem Relation Age of Onset   Colon cancer Father    Cancer Father        colon   Transient ischemic attack Father        history of   Arthritis Mother    Heart failure Mother    Hypertension Mother    Kidney failure Mother      ROS:  Please see the history of present illness.   Physical Exam/Data: Vitals:   05/30/24 1644 05/30/24 2043 05/31/24 0450 05/31/24 0805  BP: (!) 155/73 (!) 144/70 (!) 160/70 (!) 160/78  Pulse: 71 67 (!) 56 (!) 57  Resp: 19  20 12 15   Temp: 98.2 F (36.8 C) 98.3 F (36.8 C) 97.6 F (36.4 C) 97.8 F (36.6 C)  TempSrc: Oral Oral Oral   SpO2: 100% 100% 99% 99%  Weight:      Height:        Intake/Output Summary (Last 24 hours) at 05/31/2024 1155 Last data filed at 05/31/2024 1048 Gross per 24 hour  Intake 360 ml  Output --  Net 360 ml      05/26/2024  2:44 PM 09/15/2017    3:23 PM 06/16/2017    2:43 PM  Last 3 Weights  Weight (lbs) 205 lb 223 lb 1.6 oz 225 lb 14.4 oz  Weight (kg) 92.987 kg 101.197 kg 102.468 kg     Body mass index is 32.11 kg/m.  General:  Well nourished, well developed, in no acute distress HEENT: normal Neck: no JVD Vascular: No carotid bruits; Distal pulses 2+ bilaterally Cardiac:  normal S1, S2; RRR; no murmur  Lungs:  clear to auscultation bilaterally, no wheezing, rhonchi or rales  Abd: soft, nontender, no hepatomegaly  Ext: no edema Skin: warm and dry  Psych:  Normal affect   EKG:  The EKG was personally reviewed and demonstrates:  sinus rhythm with LBBB, nonspecific T wave abnormalities, rate 86 bpm Telemetry:  Telemetry was personally reviewed and demonstrates:  not on telemetry  Relevant CV Studies:  Echo ordered  Laboratory Data: High Sensitivity Troponin:  No results for input(s): TROPONINIHS in the last 720 hours.   Chemistry Recent Labs  Lab 05/28/24 0324 05/29/24 0539 05/31/24 0426  NA 122* 132* 133*  K 3.7 3.9 4.2  CL 96* 99 101  CO2 20* 24 25  GLUCOSE 98 93 85  BUN 14 16 16   CREATININE 0.88 0.99 0.94  CALCIUM 8.7* 9.1 8.9  GFRNONAA >60 >60 >60  ANIONGAP 6 9 7     No results for input(s): PROT, ALBUMIN, AST, ALT, ALKPHOS, BILITOT in the last 168 hours. Lipids No results for input(s): CHOL, TRIG, HDL, LABVLDL, LDLCALC, CHOLHDL in the last 168 hours.  Hematology Recent Labs  Lab 05/26/24 1448 05/27/24 0502  WBC 9.1 6.2  RBC 5.17 5.03  HGB 14.6 14.3  HCT 42.4 41.0  MCV 82.0 81.5  MCH 28.2 28.4  MCHC 34.4 34.9   RDW 13.4 13.2  PLT 235 204   Thyroid No results for input(s): TSH, FREET4 in the last 168 hours.  BNP Recent Labs  Lab 05/27/24 0502  BNP 130.0*    DDimer No results for input(s): DDIMER in the last 168 hours.  Radiology/Studies:  No results found.   Assessment and Plan:  Lightheadedness/dizziness - Patient presenting 7/24 with 2-week history of dyspnea and dizziness.  Treated for COPD exacerbation with improvements in dyspnea.  However, patient continues to complain of lightheadedness occurring most often with exertion but also at rest. - Orthostatics negative - PTA carvedilol  dose reduced yesterday without improvement in symptoms - Echocardiogram and bilateral carotid ultrasound ordered - Recommend telemetry monitoring for arrhythmia or significant pauses - Repeat 12 lead EKG - Hyponatremia likely contributing to symptoms  Hyponatremia - Sodium 122 on arrival, now 133 on salt tablets - Suspect this is contributing to symptoms of lightheadedness/dizziness  Hypertension - Blood pressure remains above goal - PTA carvedilol  dose reduced yesterday secondary to lightheadedness/dizziness - Started on amlodipine  5 mg daily today - Recommend further titration of antihypertensives   For questions or updates, please contact [facility] Please consult [email or web address] for contact info under    Signed, Lesley LITTIE Maffucci, PA-C  05/31/2024 11:55 AM

## 2024-06-01 ENCOUNTER — Telehealth (HOSPITAL_COMMUNITY): Payer: Self-pay | Admitting: Pharmacy Technician

## 2024-06-01 ENCOUNTER — Inpatient Hospital Stay

## 2024-06-01 ENCOUNTER — Other Ambulatory Visit (HOSPITAL_COMMUNITY): Payer: Self-pay

## 2024-06-01 DIAGNOSIS — E871 Hypo-osmolality and hyponatremia: Secondary | ICD-10-CM | POA: Diagnosis not present

## 2024-06-01 DIAGNOSIS — E785 Hyperlipidemia, unspecified: Secondary | ICD-10-CM | POA: Diagnosis not present

## 2024-06-01 DIAGNOSIS — R42 Dizziness and giddiness: Secondary | ICD-10-CM

## 2024-06-01 DIAGNOSIS — I6522 Occlusion and stenosis of left carotid artery: Secondary | ICD-10-CM

## 2024-06-01 DIAGNOSIS — I739 Peripheral vascular disease, unspecified: Secondary | ICD-10-CM | POA: Diagnosis not present

## 2024-06-01 DIAGNOSIS — J9801 Acute bronchospasm: Principal | ICD-10-CM

## 2024-06-01 LAB — ECHOCARDIOGRAM COMPLETE
Area-P 1/2: 2.9 cm2
Height: 67 in
S' Lateral: 3.2 cm
Weight: 3280 [oz_av]

## 2024-06-01 LAB — BASIC METABOLIC PANEL WITH GFR
Anion gap: 11 (ref 5–15)
BUN: 19 mg/dL (ref 8–23)
CO2: 25 mmol/L (ref 22–32)
Calcium: 9.2 mg/dL (ref 8.9–10.3)
Chloride: 98 mmol/L (ref 98–111)
Creatinine, Ser: 1.07 mg/dL (ref 0.61–1.24)
GFR, Estimated: >60 mL/min (ref >60)
Glucose, Bld: 119 mg/dL — ABNORMAL HIGH (ref 70–99)
Potassium: 3.4 mmol/L — ABNORMAL LOW (ref 3.5–5.1)
Sodium: 134 mmol/L — ABNORMAL LOW (ref 135–145)

## 2024-06-01 LAB — LIPID PANEL
Cholesterol: 181 mg/dL (ref 0–200)
HDL: 51 mg/dL (ref >40)
LDL Cholesterol: 91 mg/dL (ref 0–99)
Total CHOL/HDL Ratio: 3.5 ratio
Triglycerides: 195 mg/dL — ABNORMAL HIGH (ref <150)
VLDL: 39 mg/dL (ref 0–40)

## 2024-06-01 MED ORDER — POTASSIUM CHLORIDE CRYS ER 20 MEQ PO TBCR
40.0000 meq | EXTENDED_RELEASE_TABLET | Freq: Once | ORAL | Status: AC
  Administered 2024-06-01: 40 meq via ORAL
  Filled 2024-06-01: qty 2

## 2024-06-01 MED ORDER — IOHEXOL 350 MG/ML SOLN
75.0000 mL | Freq: Once | INTRAVENOUS | Status: AC | PRN
  Administered 2024-06-01: 75 mL via INTRAVENOUS

## 2024-06-01 MED ORDER — EZETIMIBE 10 MG PO TABS
10.0000 mg | ORAL_TABLET | Freq: Every day | ORAL | Status: DC
  Administered 2024-06-01 – 2024-06-02 (×2): 10 mg via ORAL
  Filled 2024-06-01 (×2): qty 1

## 2024-06-01 NOTE — Progress Notes (Signed)
 Progress Note   Patient: James [name] FMW:[id number] DOB: [birth year] DOA: 05/26/2024     6 DOS: the patient was seen and examined on 06/01/2024   Brief hospital course:  Mr. Arek appears is a 68 year old male with no prior diagnosis of COPD presents emergency department for chief concerns of shortness of breath.   Vitals in the ED showed temperature of 99, respiration rate 22, heart rate 88, blood pressure 168/89, SpO2 98% on room air.   Serum sodium is 122, potassium 4.8, chloride 90, bicarb 21, BUN of 11, serum creatinine 0.87, EGFR greater than 60, nonfasting blood glucose 101, WBC 9.1, hemoglobin 14.6, platelets of 235.   ED treatment: DuoNebs x 2, Solu-Medrol  125 mg IV x 2, sodium chloride  500 mL liter bolus.   7/26.  Sodium 122 today.  Patient not sleeping and feeling a little bit dizzy.  Appetites been poor lately but better since coming into the hospital.   7/27 improved sodium levels.  Continues to complain of difficulty sleeping and feeling dizzy.   7/28 Continues to complain of feeling dizzy at rest. Able to ambulate in the hall  7/29 continues to complain of feeling dizzy despite a decrease in the dose of his carvedilol .  Negative orthostatic blood pressure changes.   7/30 Symptoms unchanged. Remains symptomatic   Assessment and Plan:   Hyponatremia Improved sodium levels since starting salt tablets Will monitor     COPD with acute exacerbation (HCC) Patient was given Solu-Medrol  and switched over to prednisone  and has completed a 5-day course of therapy.   Continue inhalers PRN     Hypertension Holding parameters for carvedilol  due to relative bradycardia Continue Amlodipine  will increase dose to 10 mg daily        Generalized anxiety disorder Home alprazolam  0.5 mg 3 times daily as needed for anxiety resumed.  Started on trazodone  at night for insomnia with minimal improvement     Insomnia Trial of trazodone  at night     Dizziness No orthostatic  blood pressure changes Presumed to be medication induced as patient is on beta blockers which can cause bradycardia Hold carvedilol  to 3.125 mg twice daily since patient's heart rate has been in the low 50s 2D echocardiogram showed a normal LVEF of 55 to 60% with normal LV function and grade 1 diastolic dysfunction. Carotid Doppler showed significant noncalcified plaque in the mid left common carotid artery in the neck with elevated velocities. Stenosis may be 70% or less.  Greater based on velocities. Correlation with CT angiography of the neck may be helpful. Mild plaque in the distal right carotid bulb and proximal right ICA. Estimated right ICA stenosis is less than 50%. Mild plaque at the level of the proximal left ICA. Estimated left ICA stenosis is less than 50%. Incidental 0.8 cm solid left and 1.6 cm cystic right thyroid nodules. Appreciate cardiology input Will obtain MRI of the brain to rule out cerebellar infarct.  Obtain CT angiogram of the head and neck to rule out any LVO             Subjective: Remains symptomatic with complaints of dizziness  Physical Exam: Vitals:   05/31/24 1604 05/31/24 1946 06/01/24 0400 06/01/24 0746  BP: 134/63 (!) 150/63 (!) 151/70 (!) 151/57  Pulse: 66 64 (!) 56 (!) 58  Resp: 20 18 18 16   Temp: 98.3 F (36.8 C) 98.4 F (36.9 C) 97.6 F (36.4 C) 97.8 F (36.6 C)  TempSrc: Oral Oral Oral Oral  SpO2: 95%  98% 100% 98%  Weight:      Height:       HENT:     Head: Normocephalic.  Eyes:     General: Lids are normal.     Conjunctiva/sclera: Conjunctivae normal.  Cardiovascular:     Rate and Rhythm: Normal rate and regular rhythm.     Heart sounds: Normal heart sounds, S1 normal and S2 normal.  Pulmonary:     Breath sounds: Bilateral air entry, no wheezing, rhonchi or rales.  Abdominal:     Palpations: Abdomen is soft.     Tenderness: There is no abdominal tenderness.  Musculoskeletal:     Right upper leg: No swelling.     Left  upper leg: No swelling.  Skin:    General: Skin is warm.     Findings: No rash.  Neurological:     Mental Status: He is alert and oriented to person, place, and time.    Data Reviewed: Labs reviewed.  Potassium 3.4, sodium 134 Labs reviewed  Family Communication: Plan of care was discussed with patient at the bedside.  He verbalizes understanding and agrees with the plan.  Disposition: Status is: Inpatient Remains inpatient appropriate because: Stroke workup   Planned Discharge Destination: Home    Time spent: 40 minutes  Author: Aimee Somerset, MD 06/01/2024 1:21 PM  For on call review [email or web address].

## 2024-06-01 NOTE — Telephone Encounter (Signed)
 Patient Product/process development scientist completed.    The patient is insured through [facility]. Patient has Medicare and is not eligible for a copay card, but may be able to apply for patient assistance or Medicare RX Payment Plan (Patient Must reach out to their plan, if eligible for payment plan), if available.    Ran test claim for Repatha Sureclick 140 mg/ml and Requires Prior Authorization   This test claim was processed through [facility]- copay amounts may vary at other pharmacies due to [employer], or as the patient moves through the different stages of their insurance plan.     Roland Earl, CPHT Pharmacy Technician III Certified Patient Advocate [facility] Patient Advocate Team Direct Number: [phone]  Fax: [phone]

## 2024-06-01 NOTE — Plan of Care (Signed)

## 2024-06-01 NOTE — Telephone Encounter (Signed)
 Pharmacy Patient Advocate Encounter   Received notification from Inpatient Request that prior authorization for Repatha  SureClick 140MG /ML auto-injectors is required/requested.   Insurance verification completed.   The patient is insured through [employer] .   Per test claim: PA required; PA submitted to above mentioned insurance via CoverMyMeds Key/confirmation #/EOC [id number] Status is pending

## 2024-06-01 NOTE — Telephone Encounter (Signed)
 Pharmacy Patient Advocate Encounter  Received notification from [facility] that Prior Authorization for Repatha  SureClick 140MG /ML auto-injectors  has been APPROVED from 06/01/2024 to 12/02/2024. Ran test claim, Copay is $0.00. This test claim was processed through [facility]- copay amounts may vary at other pharmacies due to pharmacy/plan contracts, or as the patient moves through the different stages of their insurance plan.   PA #/Case ID/Reference #: [id number]

## 2024-06-01 NOTE — Progress Notes (Signed)
 Rounding Note   Patient Name: James [name] Date of Encounter: 06/01/2024  [facility] Cardiologist: None   Subjective Patient reports constant lightheadedness which is worsened since admission. Constant and not improved with lying position. Orthostatics negative.  No improvement with reduced dose of carvedilol .  He denies chest pain, palpitations, shortness of breath.  Scheduled Meds:  amLODipine   5 mg Oral Daily   carvedilol   3.125 mg Oral QAC supper   carvedilol   3.125 mg Oral Q breakfast   ezetimibe   10 mg Oral Daily   fluticasone  furoate-vilanterol  1 puff Inhalation Daily   heparin   5,000 Units Subcutaneous Q8H   meclizine   25 mg Oral TID   polyethylene glycol  17 g Oral Daily   sodium chloride   1 g Oral TID WC   traZODone   50 mg Oral QHS   Continuous Infusions:  PRN Meds: albuterol , ALPRAZolam , mouth rinse, traMADol    Vital Signs  Vitals:   05/31/24 1604 05/31/24 1946 06/01/24 0400 06/01/24 0746  BP: 134/63 (!) 150/63 (!) 151/70 (!) 151/57  Pulse: 66 64 (!) 56 (!) 58  Resp: 20 18 18 16   Temp: 98.3 F (36.8 C) 98.4 F (36.9 C) 97.6 F (36.4 C) 97.8 F (36.6 C)  TempSrc: Oral Oral Oral Oral  SpO2: 95% 98% 100% 98%  Weight:      Height:        Intake/Output Summary (Last 24 hours) at 06/01/2024 1033 Last data filed at 06/01/2024 0755 Gross per 24 hour  Intake 358 ml  Output 295 ml  Net 63 ml      05/26/2024    2:44 PM 09/15/2017    3:23 PM 06/16/2017    2:43 PM  Last 3 Weights  Weight (lbs) 205 lb 223 lb 1.6 oz 225 lb 14.4 oz  Weight (kg) 92.987 kg 101.197 kg 102.468 kg      Telemetry Sinus rhythm with LBBB- Personally Reviewed  Physical Exam  GEN: No acute distress.   Neck: No JVD Cardiac: RRR, no murmurs, rubs, or gallops.  Respiratory: Clear to auscultation bilaterally. GI: Soft, nontender, non-distended  MS: No edema; No deformity. Neuro:  Nonfocal  Psych: Normal affect   Labs High Sensitivity Troponin:  No results for  input(s): TROPONINIHS in the last 720 hours.   Chemistry Recent Labs  Lab 05/29/24 0539 05/31/24 0426 06/01/24 0947  NA 132* 133* 134*  K 3.9 4.2 3.4*  CL 99 101 98  CO2 24 25 25   GLUCOSE 93 85 119*  BUN 16 16 19   CREATININE 0.99 0.94 1.07  CALCIUM 9.1 8.9 9.2  MG  --  2.4  --   GFRNONAA >60 >60 >60  ANIONGAP 9 7 11     Lipids No results for input(s): CHOL, TRIG, HDL, LABVLDL, LDLCALC, CHOLHDL in the last 168 hours.  Hematology Recent Labs  Lab 05/26/24 1448 05/27/24 0502  WBC 9.1 6.2  RBC 5.17 5.03  HGB 14.6 14.3  HCT 42.4 41.0  MCV 82.0 81.5  MCH 28.2 28.4  MCHC 34.4 34.9  RDW 13.4 13.2  PLT 235 204   Thyroid No results for input(s): TSH, FREET4 in the last 168 hours.  BNP Recent Labs  Lab 05/27/24 0502  BNP 130.0*    DDimer No results for input(s): DDIMER in the last 168 hours.   Radiology   US  Carotid Bilateral Result Date: 05/31/2024 IMPRESSION: 1. Significant noncalcified plaque in the mid left common carotid artery in the neck with elevated velocities. Stenosis may  be 70% or greater based on velocities. Correlation with CT angiography of the neck may be helpful. 2. Mild plaque in the distal right carotid bulb and proximal right ICA. Estimated right ICA stenosis is less than 50%. 3. Mild plaque at the level of the proximal left ICA. Estimated left ICA stenosis is less than 50%. 4. Incidental 0.8 cm solid left and 1.6 cm cystic right thyroid nodules. Electronically Signed   By: Marcey Moan M.D.   On: 05/31/2024 15:20   Cardiac Studies  05/31/2024 Echo complete 1. Left ventricular ejection fraction, by estimation, is 55 to 60%. The  left ventricle has normal function. The left ventricle has no regional  wall motion abnormalities. Left ventricular diastolic parameters are  consistent with Grade I diastolic  dysfunction (impaired relaxation).   2. Right ventricular systolic function is normal. The right ventricular  size is normal.  Tricuspid regurgitation signal is inadequate for assessing  PA pressure.   3. The mitral valve is normal in structure. No evidence of mitral valve  regurgitation. No evidence of mitral stenosis.   4. The aortic valve is normal in structure. Aortic valve regurgitation is  not visualized. No aortic stenosis is present.   5. The inferior vena cava is normal in size with greater than 50%  respiratory variability, suggesting right atrial pressure of 3 mmHg.   Patient Profile   68 y.o. male with history of prior tobacco use, wheezing without formal COPD diagnosis, hypertension, hyperlipidemia, and LBBB who is being seen for the ongoing evaluation of dizziness.  Assessment & Plan   Lightheadedness/dizziness - Patient presenting 7/24 with 2-week history of dyspnea and dizziness.  Treated for COPD exacerbation with improvements in dyspnea.  However, patient continues to complain of lightheadedness which is constant in nature.  - Overall, symptoms do not seem to be orthostatic in nature given that they are constant and not improved with lying. Orthostatic vital signs negative.  - PTA carvedilol  dose reduced without change in symptoms - Echo unremarkable - Carotid ultrasound showed significant noncalcified plaque in the mid left common carotid, may require CTA of the area for further evaluation - No significant arrhythmia or pauses noted on telemetry.  Recommend 2 week ZIO monitor on discharge. - Consider MRI to rule out TIA/CVA  Hypertension - BP remains above goal - PTA carvedilol  dose reduced yesterday secondary to lightheadedness/dizziness - Started on amlodipine  5 mg daily today - Recommend further titration of antihypertensives  Hyponatremia - Sodium 122 on arrival, now 133 on salt tablets - Suspect this could be contributing to symptoms of lightheadedness/dizziness  Hyperlipidemia - Patient is reportedly intolerant to statins. Has tried several with adverse effect of abdominal pain.  -  Check lipid panel - Repatha  has been approved by his insurance  For questions or updates, please contact [facility] Please consult [email or web address] for contact info under     Signed, Lesley LITTIE Maffucci, PA-C  06/01/2024, 10:33 AM

## 2024-06-02 ENCOUNTER — Other Ambulatory Visit: Payer: Self-pay

## 2024-06-02 ENCOUNTER — Inpatient Hospital Stay

## 2024-06-02 ENCOUNTER — Inpatient Hospital Stay
Admit: 2024-06-02 | Discharge: 2024-06-02 | Disposition: A | Discharge status: Home / Self Care | Attending: Nurse Practitioner | Admitting: Nurse Practitioner

## 2024-06-02 DIAGNOSIS — R42 Dizziness and giddiness: Secondary | ICD-10-CM | POA: Diagnosis not present

## 2024-06-02 DIAGNOSIS — R55 Syncope and collapse: Secondary | ICD-10-CM

## 2024-06-02 DIAGNOSIS — R062 Wheezing: Secondary | ICD-10-CM

## 2024-06-02 DIAGNOSIS — I739 Peripheral vascular disease, unspecified: Secondary | ICD-10-CM | POA: Diagnosis not present

## 2024-06-02 DIAGNOSIS — E785 Hyperlipidemia, unspecified: Secondary | ICD-10-CM | POA: Diagnosis not present

## 2024-06-02 DIAGNOSIS — E871 Hypo-osmolality and hyponatremia: Secondary | ICD-10-CM | POA: Diagnosis not present

## 2024-06-02 MED ORDER — POLYETHYLENE GLYCOL 3350 17 GM/SCOOP PO POWD
17.0000 g | Freq: Every day | ORAL | 0 refills | Status: AC
  Filled 2024-06-02: qty 238, 14d supply, fill #0

## 2024-06-02 MED ORDER — SODIUM CHLORIDE 1 G PO TABS
1.0000 g | ORAL_TABLET | Freq: Two times a day (BID) | ORAL | 0 refills | Status: AC
  Filled 2024-06-02: qty 10, 5d supply, fill #0

## 2024-06-02 MED ORDER — REPATHA 140 MG/ML ~~LOC~~ SOSY
140.0000 mg | PREFILLED_SYRINGE | SUBCUTANEOUS | 0 refills | Status: DC
  Filled 2024-06-02: qty 2, 28d supply, fill #0

## 2024-06-02 MED ORDER — AMLODIPINE BESYLATE 10 MG PO TABS
10.0000 mg | ORAL_TABLET | Freq: Every day | ORAL | 11 refills | Status: DC
  Filled 2024-06-02: qty 30, 30d supply, fill #0
  Filled 2024-07-02: qty 30, 30d supply, fill #1

## 2024-06-02 MED ORDER — ALBUTEROL SULFATE HFA 108 (90 BASE) MCG/ACT IN AERS
2.0000 | INHALATION_SPRAY | Freq: Four times a day (QID) | RESPIRATORY_TRACT | 0 refills | Status: DC | PRN
  Filled 2024-06-02: qty 18, 30d supply, fill #0

## 2024-06-02 MED ORDER — FLUTICASONE FUROATE-VILANTEROL 200-25 MCG/ACT IN AEPB
1.0000 | INHALATION_SPRAY | Freq: Every day | RESPIRATORY_TRACT | 0 refills | Status: AC
  Filled 2024-06-02: qty 60, 30d supply, fill #0

## 2024-06-02 NOTE — Discharge Summary (Addendum)
 Physician Discharge Summary   Patient: James [name] MRN: [id number] DOB: [birth year]  Admit date:     05/26/2024  Discharge date: 06/02/24  Discharge Physician: Lailyn Appelbaum   PCP: Donal Channing SQUIBB, FNP   Recommendations at discharge:   Take medications as recommended Keep scheduled follow-up appointment with primary care provider Follow-up with cardiology and vascular surgery as an outpatient  Discharge Diagnoses: Principal Problem:   Acute hyponatremia Active Problems:   COPD with acute exacerbation (HCC)   Hypertension   Generalized anxiety disorder   Hyperlipidemia   Shortness of breath   Insomnia   Lightheaded   PAD (peripheral artery disease) (HCC)   Stenosis of left carotid artery  Resolved Problems:   * No resolved hospital problems. [facility] Course:   Mr. James [name] appears is a 68 year old male with no prior diagnosis of COPD presents emergency department for chief concerns of shortness of breath.   Vitals in the ED showed temperature of 99, respiration rate 22, heart rate 88, blood pressure 168/89, SpO2 98% on room air.   Serum sodium is 122, potassium 4.8, chloride 90, bicarb 21, BUN of 11, serum creatinine 0.87, EGFR greater than 60, nonfasting blood glucose 101, WBC 9.1, hemoglobin 14.6, platelets of 235.   ED treatment: DuoNebs x 2, Solu-Medrol  125 mg IV x 2, sodium chloride  500 mL liter bolus.   7/26.  Sodium 122 today.  Patient not sleeping and feeling a little bit dizzy.  Appetites been poor lately but better since coming into the hospital.   7/27 improved sodium levels.  Continues to complain of difficulty sleeping and feeling dizzy.   7/28 Continues to complain of feeling dizzy at rest. Able to ambulate in the hall  7/29 continues to complain of feeling dizzy despite a decrease in the dose of his carvedilol .  Negative orthostatic blood pressure changes.   7/30 Symptoms unchanged. Remains symptomatic   Assessment and  Plan:  Hyponatremia Improved sodium levels since starting salt tablets      COPD with acute exacerbation (HCC) Patient was given Solu-Medrol  and switched over to prednisone  and has completed a 5-day course of therapy.   Continue inhalers PRN as well as inhaled steroids     Hypertension Carvedilol  has been discontinued due to relative bradycardia Continue Amlodipine  10 mg daily        Generalized anxiety disorder Home alprazolam  0.5 mg 3 times daily as needed for anxiety resumed.       Insomnia Started on trazodone  at night for insomnia with minimal improvement   Dyslipidemia Will be discharged on Repatha  Unable to tolerate atorvastatin     Dizziness No orthostatic blood pressure changes Presumed to be medication induced as patient is on beta blockers which can cause bradycardia Carvedilol  has been discontinued since patient's heart rate has been in the low 50s 2D echocardiogram showed a normal LVEF of 55 to 60% with normal LV function and grade 1 diastolic dysfunction. Carotid Doppler showed significant noncalcified plaque in the mid left common carotid artery in the neck with elevated velocities. Stenosis may be 70% or less.  Greater based on velocities. Correlation with CT angiography of the neck may be helpful. Mild plaque in the distal right carotid bulb and proximal right ICA. Estimated right ICA stenosis is less than 50%. Mild plaque at the level of the proximal left ICA. Estimated left ICA stenosis is less than 50%. Incidental 0.8 cm solid left and 1.6 cm cystic right thyroid nodules. Appreciate cardiology input MRI of  the brain was negative for an acute stroke CT angiogram showed low-density soft plaque in the distal Left CCA, with 50% stenosis. No hemodynamically significant arterial stenosis.  Follow-up with vascular surgery as an outpatient         Consultants: Cardiology Procedures performed: CT angiogram of the head and neck, MRI of the  brain Disposition: Home Diet recommendation:  Discharge Diet Orders (From admission, onward)     Start     Ordered   06/02/24 0000  Diet - low sodium heart healthy        06/02/24 1043           Cardiac diet DISCHARGE MEDICATION: Allergies as of 06/02/2024       Reactions   Naprosyn [naproxen] Itching        Medication List     STOP taking these medications    atorvastatin 20 MG tablet Commonly known as: LIPITOR   carvedilol  6.25 MG tablet Commonly known as: COREG    fluticasone  50 MCG/ACT nasal spray Commonly known as: FLONASE    Vitamin D  (Ergocalciferol ) 1.25 MG (50000 UNIT) Caps capsule Commonly known as: DRISDOL        TAKE these medications    albuterol  108 (90 Base) MCG/ACT inhaler Commonly known as: VENTOLIN  HFA Inhale 2 puffs into the lungs every 6 (six) hours as needed for wheezing or shortness of breath. What changed:  when to take this Another medication with the same name was removed. Continue taking this medication, and follow the directions you see here.   ALPRAZolam  0.5 MG tablet Commonly known as: XANAX  Take 1 tablet (0.5 mg total) 3 (three) times daily as needed by mouth for anxiety.   amLODipine  10 MG tablet Commonly known as: NORVASC  Take 1 tablet (10 mg total) by mouth daily.   Breo Ellipta  200-25 MCG/ACT Aepb Generic drug: fluticasone  furoate-vilanterol Inhale 1 puff into the lungs daily. Start taking on: June 03, 2024   polyethylene glycol powder 17 GM/SCOOP powder Commonly known as: GLYCOLAX /MIRALAX  Take 17 g by mouth daily. Start taking on: June 03, 2024   Repatha  140 MG/ML Sosy Generic drug: Evolocumab  Inject 140 mg into the skin every 14 (fourteen) days.   sodium chloride  1 g tablet Take 1 tablet (1 g total) by mouth 2 (two) times daily with a meal for 5 days.        Follow-up Information     Donal Channing SQUIBB, FNP. Go in 1 week(s).   Specialty: Family Medicine Why: Needs a sleep study set up as an out  patient  go to appt. on 06/06/24 @10 :30am Contact information: [address] [place] [address] [phone]         Perla Evalene PARAS, MD. Schedule an appointment as soon as possible for a visit in 2 week(s).   Specialty: Cardiology Why: call and make appt. for 06/16/24 Contact information: [address] [place] [address] [phone]         Marea Selinda RAMAN, MD. Go in 2 week(s).   Specialties: Vascular Surgery, Radiology, Interventional Cardiology Why: Carotid artery stenosis  go to appt. on 06/14/24 @8 :45am Contact information: [address] [place] [address] [phone]                Discharge Exam: Fredricka Weights   05/26/24 1444  Weight: 93 kg   HENT:     Head: Normocephalic.  Eyes:     General: Lids are normal.     Conjunctiva/sclera: Conjunctivae normal.  Cardiovascular:     Rate and Rhythm: Normal rate and regular rhythm.     Heart sounds: Normal heart sounds, S1 normal and S2 normal.  Pulmonary:     Breath sounds: Bilateral air entry, no wheezing, rhonchi or rales.  Abdominal:     Palpations: Abdomen is soft.     Tenderness: There is no abdominal tenderness.  Musculoskeletal:     Right upper leg: No swelling.     Left upper leg: No swelling.  Skin:    General: Skin is warm.     Findings: No rash.  Neurological:     Mental Status: He is alert and oriented to person, place, and time.    Condition at discharge: stable  The results of significant diagnostics from this hospitalization (including imaging, microbiology, ancillary and laboratory) are listed below for reference.   Imaging Studies: MR BRAIN WO CONTRAST Result Date: 06/02/2024 CLINICAL DATA:  Neuro deficit, acute, stroke suspected EXAM: MRI HEAD WITHOUT CONTRAST TECHNIQUE: Multiplanar, multiecho pulse sequences of the brain and surrounding structures were obtained without intravenous contrast. COMPARISON:  None Available. FINDINGS:  Brain: No acute infarction, hemorrhage, hydrocephalus, extra-axial collection or mass lesion. Vascular: Major arterial flow voids are maintained at the skull base. Skull and upper cervical spine: Normal marrow signal. Sinuses/Orbits: Negative. Other: None. IMPRESSION: No evidence of acute intracranial abnormality. Electronically Signed   By: Gilmore GORMAN Molt M.D.   On: 06/02/2024 01:35   CT ANGIO HEAD NECK W WO CM Result Date: 06/01/2024 CLINICAL DATA:  68 year old male with shortness of breath, neurologic deficit. EXAM: CT ANGIOGRAPHY HEAD AND NECK WITH AND WITHOUT CONTRAST TECHNIQUE: Multidetector CT imaging of the head and neck was performed using the standard protocol during bolus administration of intravenous contrast. Multiplanar CT image reconstructions and MIPs were obtained to evaluate the vascular anatomy. Carotid stenosis measurements (when applicable) are obtained utilizing NASCET criteria, using the distal internal carotid diameter as the denominator. RADIATION DOSE REDUCTION: This exam was performed according to the departmental dose-optimization program which includes automated exposure control, adjustment of the mA and/or kV according to patient size and/or use of iterative reconstruction technique. CONTRAST:  75mL OMNIPAQUE  IOHEXOL  350 MG/ML SOLN COMPARISON:  None Available. FINDINGS: CT HEAD Brain: No midline shift, ventriculomegaly, mass effect, evidence of mass lesion, intracranial hemorrhage or evidence of cortically based acute infarction. Gray-white matter differentiation is within normal limits throughout the brain. Cerebral volume is within normal limits for age. Calvarium and skull base: Intact, negative. Paranasal sinuses: Visualized paranasal sinuses and mastoids are clear. Orbits: Visualized orbits and scalp soft tissues are within normal limits. CTA NECK Skeleton: Ordinary cervical spine degeneration. No acute osseous abnormality identified. Upper chest: Centrilobular emphysema.  Visible upper lungs well aerated, mild apical scarring. Negative visible superior mediastinum. Other neck: Subcentimeter bilateral thyroid nodules (no follow-up imaging recommended). Other nonvascular neck soft tissue spaces are within normal limits. Aortic arch: Calcified aortic atherosclerosis.  3 vessel arch. Right carotid system: Brachiocephalic origin soft more so than calcified plaque without stenosis. Negative right CCA origin. Mild soft plaque in the right CCA before the bifurcation (series 8, image 113) without stenosis. Similar mild soft plaque at the bifurcation and proximal right ICA. Tortuosity of the right ICA below the skull base with no stenosis. Left carotid system: Similar mild soft plaque of the proximal left CCA without stenosis. Distal left CCA moderate, bulky low-density soft plaque along the medial vessel series 8, image 114. Up to 50 % stenosis with respect to the distal vessel. Left  carotid bifurcation is patent with mild soft plaque. Tortuous left ICA below the skull base. No other stenosis. Vertebral arteries: Mild proximal right subclavian artery plaque without stenosis. Normal right vertebral artery origin. Patent right vertebral artery to the skull base without stenosis. Proximal left subclavian artery soft plaque without stenosis. Normal left vertebral artery origin. Non dominant left vertebral artery is patent to the skull base without stenosis. CTA HEAD Posterior circulation: Patent distal vertebral arteries and vertebrobasilar junction with no significant plaque or stenosis. Patent PICA origins. Patent and fenestrated (normal variant) proximal basilar artery. Patent distal basilar artery, SCA and PCA origins. Posterior communicating arteries are diminutive or absent. Bilateral PCA branches are within normal limits. Anterior circulation: Both ICA siphons are patent with mild for age atherosclerosis. No significant siphon stenosis. Mostly calcified siphon plaque. Patent carotid  termini. Patent MCA and ACA origins. Normal anterior communicating artery. Bilateral ACA branches are within normal limits. Left MCA M1 segment and trifurcation patent without stenosis. Right MCA M1 segment is tortuous, patent right MCA trifurcation without stenosis. Bilateral MCA branches are within normal limits. Venous sinuses: Patent. Anatomic variants: Dominant right vertebral artery. Fenestrated proximal basilar artery. Review of the MIP images confirms the above findings IMPRESSION: 1. CTA is negative for large vessel occlusion. Normal for age CT appearance of the brain. 2. Generally mild for age atherosclerosis in the head and neck but bulky low-density soft plaque in the distal Left CCA, with 50% stenosis. No hemodynamically significant arterial stenosis. Aortic Atherosclerosis (ICD10-I70.0) 3. Emphysema (ICD10-J43.9). Electronically Signed   By: VEAR Hurst M.D.   On: 06/01/2024 11:51   ECHOCARDIOGRAM COMPLETE Result Date: 06/01/2024    ECHOCARDIOGRAM REPORT   Patient Name:   JDEN WANT Date of Exam: 05/31/2024 Medical Rec #:  [id number]      Height:       67.0 in Accession #:    [id number]     Weight:       205.0 lb Date of Birth:  02/23/1956       BSA:          2.044 m Patient Age:    68 years       BP:           168/89 mmHg Patient Gender: M              HR:           66 bpm. Exam Location:  [facility] Procedure: 2D Echo, Cardiac Doppler and Color Doppler (Both Spectral and Color            Flow Doppler were utilized during procedure). Indications:     Dizziness  History:         Patient has prior history of Echocardiogram examinations, most                  recent 12/18/2015. Risk Factors:Former Smoker, Hypertension and                  Dyslipidemia.  Sonographer:     Carl Coma RDCS Referring Phys:  [id number] [facility] C[facility] Diagnosing Phys: Deatrice Cage MD IMPRESSIONS  1. Left ventricular ejection fraction, by estimation, is 55 to 60%. The left ventricle has normal function. The left ventricle  has no regional wall motion abnormalities. Left ventricular diastolic parameters are consistent with Grade I diastolic dysfunction (impaired relaxation).  2. Right ventricular systolic function is normal. The right ventricular size is normal. Tricuspid regurgitation signal is inadequate for assessing PA  pressure.  3. The mitral valve is normal in structure. No evidence of mitral valve regurgitation. No evidence of mitral stenosis.  4. The aortic valve is normal in structure. Aortic valve regurgitation is not visualized. No aortic stenosis is present.  5. The inferior vena cava is normal in size with greater than 50% respiratory variability, suggesting right atrial pressure of 3 mmHg. FINDINGS  Left Ventricle: Left ventricular ejection fraction, by estimation, is 55 to 60%. The left ventricle has normal function. The left ventricle has no regional wall motion abnormalities. The left ventricular internal cavity size was normal in size. There is  no left ventricular hypertrophy. Left ventricular diastolic parameters are consistent with Grade I diastolic dysfunction (impaired relaxation). Right Ventricle: The right ventricular size is normal. No increase in right ventricular wall thickness. Right ventricular systolic function is normal. Tricuspid regurgitation signal is inadequate for assessing PA pressure. Left Atrium: Left atrial size was normal in size. Right Atrium: Right atrial size was normal in size. Pericardium: There is no evidence of pericardial effusion. Mitral Valve: The mitral valve is normal in structure. No evidence of mitral valve regurgitation. No evidence of mitral valve stenosis. Tricuspid Valve: The tricuspid valve is normal in structure. Tricuspid valve regurgitation is not demonstrated. No evidence of tricuspid stenosis. Aortic Valve: The aortic valve is normal in structure. Aortic valve regurgitation is not visualized. No aortic stenosis is present. Pulmonic Valve: The pulmonic valve was normal in  structure. Pulmonic valve regurgitation is not visualized. No evidence of pulmonic stenosis. Aorta: The aortic root is normal in size and structure. Venous: The inferior vena cava is normal in size with greater than 50% respiratory variability, suggesting right atrial pressure of 3 mmHg. IAS/Shunts: No atrial level shunt detected by color flow Doppler.  LEFT VENTRICLE PLAX 2D LVIDd:         5.30 cm   Diastology LVIDs:         3.20 cm   LV e' medial:    7.44 cm/s LV PW:         0.80 cm   LV E/e' medial:  8.8 LV IVS:        0.60 cm   LV e' lateral:   9.78 cm/s LVOT diam:     2.00 cm   LV E/e' lateral: 6.7 LV SV:         78 LV SV Index:   38 LVOT Area:     3.14 cm  RIGHT VENTRICLE             IVC RV Basal diam:  3.60 cm     IVC diam: 1.30 cm RV S prime:     17.50 cm/s TAPSE (M-mode): 2.9 cm LEFT ATRIUM             Index        RIGHT ATRIUM           Index LA diam:        4.00 cm 1.96 cm/m   RA Area:     11.50 cm LA Vol (A2C):   33.0 ml 16.15 ml/m  RA Volume:   26.10 ml  12.77 ml/m LA Vol (A4C):   33.4 ml 16.34 ml/m LA Biplane Vol: 34.1 ml 16.69 ml/m  AORTIC VALVE LVOT Vmax:   119.50 cm/s LVOT Vmean:  75.550 cm/s LVOT VTI:    0.248 m  AORTA Ao Root diam: 3.40 cm Ao Asc diam:  3.10 cm MITRAL VALVE MV Area (PHT): 2.90 cm  SHUNTS MV Decel Time: 262 msec    Systemic VTI:  0.25 m MV E velocity: 65.25 cm/s  Systemic Diam: 2.00 cm MV A velocity: 96.95 cm/s MV E/A ratio:  0.67 Deatrice Cage MD Electronically signed by Deatrice Cage MD Signature Date/Time: 06/01/2024/7:29:21 AM    Final    US  Carotid Bilateral Result Date: 05/31/2024 CLINICAL DATA:  Dizziness and syncope. History of hypertension and hyperlipidemia. EXAM: BILATERAL CAROTID DUPLEX ULTRASOUND TECHNIQUE: Elnor scale imaging, color Doppler and duplex ultrasound were performed of bilateral carotid and vertebral arteries in the neck. COMPARISON:  None Available. FINDINGS: Criteria: Quantification of carotid stenosis is based on velocity parameters that  correlate the residual internal carotid diameter with NASCET-based stenosis levels, using the diameter of the distal internal carotid lumen as the denominator for stenosis measurement. The following velocity measurements were obtained: RIGHT ICA:  103/11 cm/sec CCA:  161/13 cm/sec SYSTOLIC ICA/CCA RATIO:  0.6 ECA:  180 cm/sec LEFT ICA:  82/15 cm/sec CCA:  270/19 cm/sec SYSTOLIC ICA/CCA RATIO:  0.3 ECA:  139 cm/sec RIGHT CAROTID ARTERY: Mild plaque in the distal carotid bulb and proximal right ICA. Estimated right ICA stenosis is less than 50%. RIGHT VERTEBRAL ARTERY: Antegrade flow with normal waveform and velocity. LEFT CAROTID ARTERY: Significant noncalcified plaque in the mid common carotid artery in the neck with elevated velocities. Stenosis may be 70% or greater based on velocities. Mild plaque at the level of the proximal left ICA. Estimated left ICA stenosis is less than 50%. LEFT VERTEBRAL ARTERY: Antegrade flow with normal waveform and velocity. Incidental 0.8 cm solid left and 1.6 cm cystic right thyroid nodules. IMPRESSION: 1. Significant noncalcified plaque in the mid left common carotid artery in the neck with elevated velocities. Stenosis may be 70% or greater based on velocities. Correlation with CT angiography of the neck may be helpful. 2. Mild plaque in the distal right carotid bulb and proximal right ICA. Estimated right ICA stenosis is less than 50%. 3. Mild plaque at the level of the proximal left ICA. Estimated left ICA stenosis is less than 50%. 4. Incidental 0.8 cm solid left and 1.6 cm cystic right thyroid nodules. Electronically Signed   By: Marcey Moan M.D.   On: 05/31/2024 15:20   DG Chest 2 View Result Date: 05/26/2024 CLINICAL DATA:  Shortness of breath.  Dizziness and chest tightness. EXAM: CHEST - 2 VIEW COMPARISON:  10/09/2015 FINDINGS: The cardiomediastinal contours are normal. Mild hyperinflation with chronic bronchial thickening. Pulmonary vasculature is normal. No  consolidation, pleural effusion, or pneumothorax. No acute osseous abnormalities are seen. IMPRESSION: Mild hyperinflation and chronic bronchial thickening. Electronically Signed   By: Andrea Gasman M.D.   On: 05/26/2024 15:54    Microbiology: No results found for this or any previous visit.  Labs: CBC: Recent Labs  Lab 05/26/24 1448 05/27/24 0502  WBC 9.1 6.2  HGB 14.6 14.3  HCT 42.4 41.0  MCV 82.0 81.5  PLT 235 204   Basic Metabolic Panel: Recent Labs  Lab 05/27/24 0502 05/28/24 0324 05/29/24 0539 05/31/24 0426 06/01/24 0947  NA 124* 122* 132* 133* 134*  K 4.2 3.7 3.9 4.2 3.4*  CL 94* 96* 99 101 98  CO2 22 20* 24 25 25   GLUCOSE 128* 98 93 85 119*  BUN 14 14 16 16 19   CREATININE 0.78 0.88 0.99 0.94 1.07  CALCIUM 8.9 8.7* 9.1 8.9 9.2  MG  --   --   --  2.4  --    Liver Function Tests:  No results for input(s): AST, ALT, ALKPHOS, BILITOT, PROT, ALBUMIN in the last 168 hours. CBG: No results for input(s): GLUCAP in the last 168 hours.  Discharge time spent: greater than 30 minutes.  Signed: Aimee Somerset, MD [facility] 06/02/2024

## 2024-06-02 NOTE — Plan of Care (Signed)
  Problem: Education: Goal: Knowledge of General Education information will improve Description: Including pain rating scale, medication(s)/side effects and non-pharmacologic comfort measures Outcome: Progressing   Problem: Health Behavior/Discharge Planning: Goal: Ability to manage health-related needs will improve Outcome: Progressing   Problem: Clinical Measurements: Goal: Respiratory complications will improve Outcome: Progressing   

## 2024-06-02 NOTE — Progress Notes (Signed)
 Cardiology Progress Note   Patient Name: James [name] Date of Encounter: 06/02/2024  Primary Cardiologist: Deatrice Cage, MD  Subjective   Patient continues to complain of constant dizziness.  No significant change in symptoms with rotation of his head or other position changes.  Does not think that meclizine  has helped to this point. Objective   Inpatient Medications    Scheduled Meds:  amLODipine   5 mg Oral Daily   carvedilol   3.125 mg Oral QAC supper   carvedilol   3.125 mg Oral Q breakfast   fluticasone  furoate-vilanterol  1 puff Inhalation Daily   heparin   5,000 Units Subcutaneous Q8H   meclizine   25 mg Oral TID   polyethylene glycol  17 g Oral Daily   sodium chloride   1 g Oral TID WC   traZODone   50 mg Oral QHS   Continuous Infusions:  PRN Meds: albuterol , ALPRAZolam , mouth rinse, traMADol    Vital Signs    Vitals:   06/01/24 2116 06/02/24 0507 06/02/24 0751 06/02/24 0826  BP: (!) 140/63 108/63 137/63 137/63  Pulse: 61 62 (!) 59 (!) 59  Resp: 20 16 14    Temp: 98.4 F (36.9 C) (!) 97.5 F (36.4 C) (!) 97.5 F (36.4 C)   TempSrc: Oral Oral Oral   SpO2: 98% 98% 95%   Weight:      Height:        Intake/Output Summary (Last 24 hours) at 06/02/2024 1251 Last data filed at 06/02/2024 1045 Gross per 24 hour  Intake 720 ml  Output --  Net 720 ml   Filed Weights   05/26/24 1444  Weight: 93 kg    Physical Exam   GEN: Well nourished, well developed, in no acute distress.  HEENT: Grossly normal.  Neck: Supple, no JVD, carotid bruits, or masses. Cardiac: RRR, no murmurs, rubs, or gallops. No clubbing, cyanosis, edema.  Radials 2+, DP/PT 2+ and equal bilaterally.  Respiratory:  Respirations regular and unlabored, clear to auscultation bilaterally. GI: Soft, nontender, nondistended, BS + x 4. MS: no deformity or atrophy. Skin: warm and dry, no rash. Neuro:  Strength and sensation are intact. Psych: AAOx3.  Normal affect.  Labs    Chemistry Recent Labs   Lab 05/29/24 0539 05/31/24 0426 06/01/24 0947  NA 132* 133* 134*  K 3.9 4.2 3.4*  CL 99 101 98  CO2 24 25 25   GLUCOSE 93 85 119*  BUN 16 16 19   CREATININE 0.99 0.94 1.07  CALCIUM 9.1 8.9 9.2  GFRNONAA >60 >60 >60  ANIONGAP 9 7 11      Hematology Recent Labs  Lab 05/26/24 1448 05/27/24 0502  WBC 9.1 6.2  RBC 5.17 5.03  HGB 14.6 14.3  HCT 42.4 41.0  MCV 82.0 81.5  MCH 28.2 28.4  MCHC 34.4 34.9  RDW 13.4 13.2  PLT 235 204   BNP    Component Value Date/Time   BNP 130.0 (H) 05/27/2024 0502   Lipids  Lab Results  Component Value Date   CHOL 181 06/01/2024   HDL 51 06/01/2024   LDLCALC 91 06/01/2024   TRIG 195 (H) 06/01/2024   CHOLHDL 3.5 06/01/2024    HbA1c  Lab Results  Component Value Date   HGBA1C 5.6 04/22/2016    Radiology    MR BRAIN WO CONTRAST Result Date: 06/02/2024 CLINICAL DATA:  Neuro deficit, acute, stroke suspected EXAM: MRI HEAD WITHOUT CONTRAST TECHNIQUE: Multiplanar, multiecho pulse sequences of the brain and surrounding structures were obtained without intravenous contrast. COMPARISON:  None Available. FINDINGS: Brain: No acute infarction, hemorrhage, hydrocephalus, extra-axial collection or mass lesion. Vascular: Major arterial flow voids are maintained at the skull base. Skull and upper cervical spine: Normal marrow signal. Sinuses/Orbits: Negative. Other: None. IMPRESSION: No evidence of acute intracranial abnormality. Electronically Signed   By: Gilmore GORMAN Molt M.D.   On: 06/02/2024 01:35   CT ANGIO HEAD NECK W WO CM Result Date: 06/01/2024 CLINICAL DATA:  68 year old male with shortness of breath, neurologic deficit. EXAM: CT ANGIOGRAPHY HEAD AND NECK WITH AND WITHOUT CONTRAST TECHNIQUE: Multidetector CT imaging of the head and neck was performed using the standard protocol during bolus administration of intravenous contrast. Multiplanar CT image reconstructions and MIPs were obtained to evaluate the vascular anatomy. Carotid stenosis  measurements (when applicable) are obtained utilizing NASCET criteria, using the distal internal carotid diameter as the denominator. RADIATION DOSE REDUCTION: This exam was performed according to the departmental dose-optimization program which includes automated exposure control, adjustment of the mA and/or kV according to patient size and/or use of iterative reconstruction technique. CONTRAST:  75mL OMNIPAQUE  IOHEXOL  350 MG/ML SOLN COMPARISON:  None Available. FINDINGS: CT HEAD Brain: No midline shift, ventriculomegaly, mass effect, evidence of mass lesion, intracranial hemorrhage or evidence of cortically based acute infarction. Gray-white matter differentiation is within normal limits throughout the brain. Cerebral volume is within normal limits for age. Calvarium and skull base: Intact, negative. Paranasal sinuses: Visualized paranasal sinuses and mastoids are clear. Orbits: Visualized orbits and scalp soft tissues are within normal limits. CTA NECK Skeleton: Ordinary cervical spine degeneration. No acute osseous abnormality identified. Upper chest: Centrilobular emphysema. Visible upper lungs well aerated, mild apical scarring. Negative visible superior mediastinum. Other neck: Subcentimeter bilateral thyroid nodules (no follow-up imaging recommended). Other nonvascular neck soft tissue spaces are within normal limits. Aortic arch: Calcified aortic atherosclerosis.  3 vessel arch. Right carotid system: Brachiocephalic origin soft more so than calcified plaque without stenosis. Negative right CCA origin. Mild soft plaque in the right CCA before the bifurcation (series 8, image 113) without stenosis. Similar mild soft plaque at the bifurcation and proximal right ICA. Tortuosity of the right ICA below the skull base with no stenosis. Left carotid system: Similar mild soft plaque of the proximal left CCA without stenosis. Distal left CCA moderate, bulky low-density soft plaque along the medial vessel series 8,  image 114. Up to 50 % stenosis with respect to the distal vessel. Left carotid bifurcation is patent with mild soft plaque. Tortuous left ICA below the skull base. No other stenosis. Vertebral arteries: Mild proximal right subclavian artery plaque without stenosis. Normal right vertebral artery origin. Patent right vertebral artery to the skull base without stenosis. Proximal left subclavian artery soft plaque without stenosis. Normal left vertebral artery origin. Non dominant left vertebral artery is patent to the skull base without stenosis. CTA HEAD Posterior circulation: Patent distal vertebral arteries and vertebrobasilar junction with no significant plaque or stenosis. Patent PICA origins. Patent and fenestrated (normal variant) proximal basilar artery. Patent distal basilar artery, SCA and PCA origins. Posterior communicating arteries are diminutive or absent. Bilateral PCA branches are within normal limits. Anterior circulation: Both ICA siphons are patent with mild for age atherosclerosis. No significant siphon stenosis. Mostly calcified siphon plaque. Patent carotid termini. Patent MCA and ACA origins. Normal anterior communicating artery. Bilateral ACA branches are within normal limits. Left MCA M1 segment and trifurcation patent without stenosis. Right MCA M1 segment is tortuous, patent right MCA trifurcation without stenosis. Bilateral MCA branches are within normal limits.  Venous sinuses: Patent. Anatomic variants: Dominant right vertebral artery. Fenestrated proximal basilar artery. Review of the MIP images confirms the above findings IMPRESSION: 1. CTA is negative for large vessel occlusion. Normal for age CT appearance of the brain. 2. Generally mild for age atherosclerosis in the head and neck but bulky low-density soft plaque in the distal Left CCA, with 50% stenosis. No hemodynamically significant arterial stenosis. Aortic Atherosclerosis (ICD10-I70.0) 3. Emphysema (ICD10-J43.9). Electronically  Signed   By: VEAR Hurst M.D.   On: 06/01/2024 11:51   US  Carotid Bilateral Result Date: 05/31/2024 CLINICAL DATA:  Dizziness and syncope. History of hypertension and hyperlipidemia. EXAM: BILATERAL CAROTID DUPLEX ULTRASOUND TECHNIQUE: Elnor scale imaging, color Doppler and duplex ultrasound were performed of bilateral carotid and vertebral arteries in the neck. COMPARISON:  None Available. FINDINGS: Criteria: Quantification of carotid stenosis is based on velocity parameters that correlate the residual internal carotid diameter with NASCET-based stenosis levels, using the diameter of the distal internal carotid lumen as the denominator for stenosis measurement. The following velocity measurements were obtained: RIGHT ICA:  103/11 cm/sec CCA:  161/13 cm/sec SYSTOLIC ICA/CCA RATIO:  0.6 ECA:  180 cm/sec LEFT ICA:  82/15 cm/sec CCA:  270/19 cm/sec SYSTOLIC ICA/CCA RATIO:  0.3 ECA:  139 cm/sec RIGHT CAROTID ARTERY: Mild plaque in the distal carotid bulb and proximal right ICA. Estimated right ICA stenosis is less than 50%. RIGHT VERTEBRAL ARTERY: Antegrade flow with normal waveform and velocity. LEFT CAROTID ARTERY: Significant noncalcified plaque in the mid common carotid artery in the neck with elevated velocities. Stenosis may be 70% or greater based on velocities. Mild plaque at the level of the proximal left ICA. Estimated left ICA stenosis is less than 50%. LEFT VERTEBRAL ARTERY: Antegrade flow with normal waveform and velocity. Incidental 0.8 cm solid left and 1.6 cm cystic right thyroid nodules. IMPRESSION: 1. Significant noncalcified plaque in the mid left common carotid artery in the neck with elevated velocities. Stenosis may be 70% or greater based on velocities. Correlation with CT angiography of the neck may be helpful. 2. Mild plaque in the distal right carotid bulb and proximal right ICA. Estimated right ICA stenosis is less than 50%. 3. Mild plaque at the level of the proximal left ICA. Estimated left  ICA stenosis is less than 50%. 4. Incidental 0.8 cm solid left and 1.6 cm cystic right thyroid nodules. Electronically Signed   By: Marcey Moan M.D.   On: 05/31/2024 15:20     Telemetry    RSR - Personally Reviewed  Cardiac Studies   05/31/2024 Echo complete 1. Left ventricular ejection fraction, by estimation, is 55 to 60%. The  left ventricle has normal function. The left ventricle has no regional  wall motion abnormalities. Left ventricular diastolic parameters are  consistent with Grade I diastolic  dysfunction (impaired relaxation).   2. Right ventricular systolic function is normal. The right ventricular  size is normal. Tricuspid regurgitation signal is inadequate for assessing  PA pressure.   3. The mitral valve is normal in structure. No evidence of mitral valve  regurgitation. No evidence of mitral stenosis.   4. The aortic valve is normal in structure. Aortic valve regurgitation is  not visualized. No aortic stenosis is present.   5. The inferior vena cava is normal in size with greater than 50%  respiratory variability, suggesting right atrial pressure of 3 mmHg.  _____________   Patient Profile     68 y.o. male with history of prior tobacco use, wheezing without formal  COPD diagnosis, hypertension, hyperlipidemia, and LBBB who is being seen for the ongoing evaluation of dizziness.   Assessment & Plan    1.  Lightheadedness: Patient with persistent lightheadedness regardless of position.  No significant improvement with meclizine  up to this point.  Not orthostatic by vital signs.  70% left carotid stenosis noted on ultrasound there are no hemodynamically significant stenoses noted on CTA (50% stenosis in the left common carotid).  MRI of the brain without acute intracranial abnormality.  No further cardiac workup warranted at this time.  He has remained in sinus rhythm on telemetry without any evidence of tacky or bradycardia arrhythmias.  With constant symptoms despite  normal monitoring, we will forego outpatient monitoring at this time.  2.  Carotid arterial disease: See #1 related to 50% common carotid artery stenosis.  No hemodynamically significant stenoses on CTA.  Pharmacy team was able to arrange for him to get Repatha  in the setting of known statin intolerance.  Would also recommend low-dose aspirin.  3.  Hyperlipidemia: Plan for Repatha  at discharge as outlined above.  4.  COPD exacerbation: No active wheezing this morning.  Per medicine team.  5.  Hyponatremia: Sodium improved at 134 yesterday morning.  Signed, Lonni Meager, NP  06/02/2024, 12:51 PM    For questions or updates, please contact   Please consult [email or web address] for contact info under Cardiology/STEMI.

## 2024-06-14 ENCOUNTER — Ambulatory Visit (INDEPENDENT_AMBULATORY_CARE_PROVIDER_SITE_OTHER): Admitting: Vascular Surgery

## 2024-06-14 ENCOUNTER — Encounter (INDEPENDENT_AMBULATORY_CARE_PROVIDER_SITE_OTHER): Payer: Self-pay | Admitting: Vascular Surgery

## 2024-06-14 VITALS — BP 145/81 | HR 82 | Resp 18 | Ht 67.0 in | Wt 199.8 lb

## 2024-06-14 DIAGNOSIS — I10 Essential (primary) hypertension: Secondary | ICD-10-CM

## 2024-06-14 DIAGNOSIS — I6522 Occlusion and stenosis of left carotid artery: Secondary | ICD-10-CM | POA: Diagnosis not present

## 2024-06-14 DIAGNOSIS — R0989 Other specified symptoms and signs involving the circulatory and respiratory systems: Secondary | ICD-10-CM | POA: Insufficient documentation

## 2024-06-14 DIAGNOSIS — E782 Mixed hyperlipidemia: Secondary | ICD-10-CM

## 2024-06-14 MED ORDER — CLOPIDOGREL BISULFATE 75 MG PO TABS: 75.0000 mg | ORAL_TABLET | Freq: Every day | ORAL | 6 refills | Status: AC

## 2024-06-14 NOTE — Addendum Note (Signed)
 Addended by: MAREA SELINDA RAMAN on: 06/14/2024 10:46 AM   Modules accepted: Orders

## 2024-06-14 NOTE — Progress Notes (Signed)
 Patient ID: James [name], male   DOB: 07/24/1956, 68 y.o.   MRN: [id number]  Chief Complaint  Patient presents with   New Patient (Initial Visit)    [facility] 2 week carotid stenosis and ref James abnormal abdominal bruit     HPI James [name] is a 68 y.o. male.  I am asked to see the patient by [name] James for evaluation of an abdominal bruit as well as carotid artery disease.  The patient has been complaining of some chest and upper back pain.  He also has noticed that his legs have become more tired with activity.  He was hospitalized about a month ago.  As part of that hospitalization he had a carotid duplex which suggested greater than 70% stenosis in his left carotid artery.  This was followed by CT angiogram which was reported as a 50% stenosis in the left common carotid artery.  I have independently reviewed the CT scan and my interpretation would be between these 2 levels.  Although I think it is worse than 50%, I do think it is less than 70%.  I have estimated this in about 60% stenosis in the left common carotid artery.   It was also noted by his primary care provider that he had an abdominal bruit.  In questioning the patient he does get leg tiredness with activity.  He does not have any mesenteric ischemic symptoms.  No unintentional weight loss or food fear.  His blood pressure is reasonably well-controlled on antihypertensives.   Past Medical History:  Diagnosis Date   Anxiety    history of   Depression    history of    Eyes sensitive to light    due to side effect of manufactured version of Gabapentin    Family history of colonic polyps    Hand pain, right    History of palpitations    intermittent; patient states heart skips a beat at times   Hyperlipidemia    Hypertension 2007   Insomnia    Shoulder injury 1976   Special screening for malignant neoplasms, colon    Spinal stenosis of lumbar region     Past Surgical History:  Procedure Laterality Date    CARDIAC CATHETERIZATION  2007   COLONOSCOPY  2013   KNEE SURGERY Left 1979   POLYPECTOMY  2013   TONSILLECTOMY  1964     Family History  Problem Relation Age of Onset   Colon cancer Father    Cancer Father        colon   Transient ischemic attack Father        history of   Arthritis Mother    Heart failure Mother    Hypertension Mother    Kidney failure Mother       Social History   Tobacco Use   Smoking status: Former    Current packs/day: 1.00    Average packs/day: 1 pack/day for 25.0 years (25.0 ttl pk-yrs)    Types: Cigarettes   Smokeless tobacco: Never  Vaping Use   Vaping status: Never Used  Substance Use Topics   Alcohol use: No    Alcohol/week: 0.0 standard drinks of alcohol   Drug use: No     Allergies  Allergen Reactions   Naprosyn [Naproxen] Itching    Current Outpatient Medications  Medication Sig Dispense Refill   albuterol  (VENTOLIN  HFA) 108 (90 Base) MCG/ACT inhaler Inhale 2 puffs into the lungs every 6 (six) hours as needed for  wheezing or shortness of breath. 18 g 0   ALPRAZolam  (XANAX ) 0.5 MG tablet Take 1 tablet (0.5 mg total) 3 (three) times daily as needed by mouth for anxiety. 90 tablet 2   amLODipine  (NORVASC ) 10 MG tablet Take 1 tablet (10 mg total) by mouth daily. 30 tablet 11   Evolocumab  (REPATHA ) 140 MG/ML SOSY Inject 140 mg into the skin every 14 (fourteen) days. 2.1 mL 0   fluticasone  furoate-vilanterol (BREO ELLIPTA ) 200-25 MCG/ACT AEPB Inhale 1 puff into the lungs daily. 60 each 0   polyethylene glycol powder (GLYCOLAX /MIRALAX ) 17 GM/SCOOP powder Take 17 g by mouth daily. 238 g 0   No current facility-administered medications for this visit.      REVIEW OF SYSTEMS (Negative unless checked)  Constitutional: [] Weight loss  [] Fever  [] Chills Cardiac: [] Chest pain   [] Chest pressure   [x] Palpitations   [] Shortness of breath when laying flat   [] Shortness of breath at rest   [] Shortness of breath with exertion. Vascular:   [] Pain in legs with walking   [] Pain in legs at rest   [] Pain in legs when laying flat   [x] Claudication   [] Pain in feet when walking  [] Pain in feet at rest  [] Pain in feet when laying flat   [] History of DVT   [] Phlebitis   [] Swelling in legs   [] Varicose veins   [] Non-healing ulcers Pulmonary:   [] Uses home oxygen   [] Productive cough   [] Hemoptysis   [] Wheeze  [] COPD   [] Asthma Neurologic:  [] Dizziness  [] Blackouts   [] Seizures   [] History of stroke   [] History of TIA  [] Aphasia   [] Temporary blindness   [] Dysphagia   [] Weakness or numbness in arms   [] Weakness or numbness in legs Musculoskeletal:  [x] Arthritis   [] Joint swelling   [] Joint pain   [] Low back pain Hematologic:  [] Easy bruising  [] Easy bleeding   [] Hypercoagulable state   [] Anemic  [] Hepatitis Gastrointestinal:  [] Blood in stool   [] Vomiting blood  [] Gastroesophageal reflux/heartburn   [] Abdominal pain Genitourinary:  [] Chronic kidney disease   [] Difficult urination  [] Frequent urination  [] Burning with urination   [] Hematuria Skin:  [] Rashes   [] Ulcers   [] Wounds Psychological:  [x] History of anxiety   [x]  History of major depression.    Physical Exam BP (!) 145/81   Pulse 82   Resp 18   Ht 5' 7 (1.702 m)   Wt 199 lb 12.8 oz (90.6 kg)   BMI 31.29 kg/m  Gen:  WD/WN, NAD Head: [place]/AT, No temporalis wasting.  Ear/Nose/Throat: Hearing grossly intact, nares w/o erythema or drainage, oropharynx w/o Erythema/Exudate Eyes: Conjunctiva clear, sclera non-icteric  Neck: trachea midline.  No JVD. No bruit.  Pulmonary:  Good air movement, respirations not labored, no use of accessory muscles  Cardiac: RRR, no JVD Vascular:  Vessel Right Left  Radial Palpable Palpable                                   Gastrointestinal:. No masses, surgical incisions, or scars. Soft abdominal bruit. Musculoskeletal: M/S 5/5 throughout.  Extremities without ischemic changes.  No deformity or atrophy. 1+ LLE edema. Neurologic: Sensation  grossly intact in extremities.  Symmetrical.  Speech is fluent. Motor exam as listed above. Psychiatric: Judgment intact, Mood & affect appropriate for pt's clinical situation. Dermatologic: No rashes or ulcers noted.  No cellulitis or open wounds.    Radiology MR BRAIN WO CONTRAST Result  Date: 06/02/2024 CLINICAL DATA:  Neuro deficit, acute, stroke suspected EXAM: MRI HEAD WITHOUT CONTRAST TECHNIQUE: Multiplanar, multiecho pulse sequences of the brain and surrounding structures were obtained without intravenous contrast. COMPARISON:  None Available. FINDINGS: Brain: No acute infarction, hemorrhage, hydrocephalus, extra-axial collection or mass lesion. Vascular: Major arterial flow voids are maintained at the skull base. Skull and upper cervical spine: Normal marrow signal. Sinuses/Orbits: Negative. Other: None. IMPRESSION: No evidence of acute intracranial abnormality. Electronically Signed   By: Gilmore GORMAN Molt M.D.   On: 06/02/2024 01:35   CT ANGIO HEAD NECK W WO CM Result Date: 06/01/2024 CLINICAL DATA:  68 year old male with shortness of breath, neurologic deficit. EXAM: CT ANGIOGRAPHY HEAD AND NECK WITH AND WITHOUT CONTRAST TECHNIQUE: Multidetector CT imaging of the head and neck was performed using the standard protocol during bolus administration of intravenous contrast. Multiplanar CT image reconstructions and MIPs were obtained to evaluate the vascular anatomy. Carotid stenosis measurements (when applicable) are obtained utilizing NASCET criteria, using the distal internal carotid diameter as the denominator. RADIATION DOSE REDUCTION: This exam was performed according to the departmental dose-optimization program which includes automated exposure control, adjustment of the mA and/or kV according to patient size and/or use of iterative reconstruction technique. CONTRAST:  75mL OMNIPAQUE  IOHEXOL  350 MG/ML SOLN COMPARISON:  None Available. FINDINGS: CT HEAD Brain: No midline shift,  ventriculomegaly, mass effect, evidence of mass lesion, intracranial hemorrhage or evidence of cortically based acute infarction. Gray-white matter differentiation is within normal limits throughout the brain. Cerebral volume is within normal limits for age. Calvarium and skull base: Intact, negative. Paranasal sinuses: Visualized paranasal sinuses and mastoids are clear. Orbits: Visualized orbits and scalp soft tissues are within normal limits. CTA NECK Skeleton: Ordinary cervical spine degeneration. No acute osseous abnormality identified. Upper chest: Centrilobular emphysema. Visible upper lungs well aerated, mild apical scarring. Negative visible superior mediastinum. Other neck: Subcentimeter bilateral thyroid nodules (no follow-up imaging recommended). Other nonvascular neck soft tissue spaces are within normal limits. Aortic arch: Calcified aortic atherosclerosis.  3 vessel arch. Right carotid system: Brachiocephalic origin soft more so than calcified plaque without stenosis. Negative right CCA origin. Mild soft plaque in the right CCA before the bifurcation (series 8, image 113) without stenosis. Similar mild soft plaque at the bifurcation and proximal right ICA. Tortuosity of the right ICA below the skull base with no stenosis. Left carotid system: Similar mild soft plaque of the proximal left CCA without stenosis. Distal left CCA moderate, bulky low-density soft plaque along the medial vessel series 8, image 114. Up to 50 % stenosis with respect to the distal vessel. Left carotid bifurcation is patent with mild soft plaque. Tortuous left ICA below the skull base. No other stenosis. Vertebral arteries: Mild proximal right subclavian artery plaque without stenosis. Normal right vertebral artery origin. Patent right vertebral artery to the skull base without stenosis. Proximal left subclavian artery soft plaque without stenosis. Normal left vertebral artery origin. Non dominant left vertebral artery is patent  to the skull base without stenosis. CTA HEAD Posterior circulation: Patent distal vertebral arteries and vertebrobasilar junction with no significant plaque or stenosis. Patent PICA origins. Patent and fenestrated (normal variant) proximal basilar artery. Patent distal basilar artery, SCA and PCA origins. Posterior communicating arteries are diminutive or absent. Bilateral PCA branches are within normal limits. Anterior circulation: Both ICA siphons are patent with mild for age atherosclerosis. No significant siphon stenosis. Mostly calcified siphon plaque. Patent carotid termini. Patent MCA and ACA origins. Normal anterior communicating artery. Bilateral ACA  branches are within normal limits. Left MCA M1 segment and trifurcation patent without stenosis. Right MCA M1 segment is tortuous, patent right MCA trifurcation without stenosis. Bilateral MCA branches are within normal limits. Venous sinuses: Patent. Anatomic variants: Dominant right vertebral artery. Fenestrated proximal basilar artery. Review of the MIP images confirms the above findings IMPRESSION: 1. CTA is negative for large vessel occlusion. Normal for age CT appearance of the brain. 2. Generally mild for age atherosclerosis in the head and neck but bulky low-density soft plaque in the distal Left CCA, with 50% stenosis. No hemodynamically significant arterial stenosis. Aortic Atherosclerosis (ICD10-I70.0) 3. Emphysema (ICD10-J43.9). Electronically Signed   By: VEAR Hurst M.D.   On: 06/01/2024 11:51   ECHOCARDIOGRAM COMPLETE Result Date: 06/01/2024    ECHOCARDIOGRAM REPORT   Patient Name:   LINNIE MCGLOCKLIN Date of Exam: 05/31/2024 Medical Rec #:  [id number]      Height:       67.0 in Accession #:    [id number]     Weight:       205.0 lb Date of Birth:  [birth year]       BSA:          2.044 m Patient Age:    68 years       BP:           168/89 mmHg Patient Gender: M              HR:           66 bpm. Exam Location:  [facility] Procedure: 2D Echo, Cardiac Doppler and  Color Doppler (Both Spectral and Color            Flow Doppler were utilized during procedure). Indications:     Dizziness  History:         Patient has prior history of Echocardiogram examinations, most                  recent 12/18/2015. Risk Factors:Former Smoker, Hypertension and                  Dyslipidemia.  Sonographer:     Carl Coma RDCS Referring Phys:  [id number] [facility] L CAREY Diagnosing Phys: Deatrice Cage MD IMPRESSIONS  1. Left ventricular ejection fraction, by estimation, is 55 to 60%. The left ventricle has normal function. The left ventricle has no regional wall motion abnormalities. Left ventricular diastolic parameters are consistent with Grade I diastolic dysfunction (impaired relaxation).  2. Right ventricular systolic function is normal. The right ventricular size is normal. Tricuspid regurgitation signal is inadequate for assessing PA pressure.  3. The mitral valve is normal in structure. No evidence of mitral valve regurgitation. No evidence of mitral stenosis.  4. The aortic valve is normal in structure. Aortic valve regurgitation is not visualized. No aortic stenosis is present.  5. The inferior vena cava is normal in size with greater than 50% respiratory variability, suggesting right atrial pressure of 3 mmHg. FINDINGS  Left Ventricle: Left ventricular ejection fraction, by estimation, is 55 to 60%. The left ventricle has normal function. The left ventricle has no regional wall motion abnormalities. The left ventricular internal cavity size was normal in size. There is  no left ventricular hypertrophy. Left ventricular diastolic parameters are consistent with Grade I diastolic dysfunction (impaired relaxation). Right Ventricle: The right ventricular size is normal. No increase in right ventricular wall thickness. Right ventricular systolic function is normal. Tricuspid regurgitation signal is inadequate for assessing PA pressure. Left  Atrium: Left atrial size was normal in  size. Right Atrium: Right atrial size was normal in size. Pericardium: There is no evidence of pericardial effusion. Mitral Valve: The mitral valve is normal in structure. No evidence of mitral valve regurgitation. No evidence of mitral valve stenosis. Tricuspid Valve: The tricuspid valve is normal in structure. Tricuspid valve regurgitation is not demonstrated. No evidence of tricuspid stenosis. Aortic Valve: The aortic valve is normal in structure. Aortic valve regurgitation is not visualized. No aortic stenosis is present. Pulmonic Valve: The pulmonic valve was normal in structure. Pulmonic valve regurgitation is not visualized. No evidence of pulmonic stenosis. Aorta: The aortic root is normal in size and structure. Venous: The inferior vena cava is normal in size with greater than 50% respiratory variability, suggesting right atrial pressure of 3 mmHg. IAS/Shunts: No atrial level shunt detected by color flow Doppler.  LEFT VENTRICLE PLAX 2D LVIDd:         5.30 cm   Diastology LVIDs:         3.20 cm   LV e' medial:    7.44 cm/s LV PW:         0.80 cm   LV E/e' medial:  8.8 LV IVS:        0.60 cm   LV e' lateral:   9.78 cm/s LVOT diam:     2.00 cm   LV E/e' lateral: 6.7 LV SV:         78 LV SV Index:   38 LVOT Area:     3.14 cm  RIGHT VENTRICLE             IVC RV Basal diam:  3.60 cm     IVC diam: 1.30 cm RV S prime:     17.50 cm/s TAPSE (M-mode): 2.9 cm LEFT ATRIUM             Index        RIGHT ATRIUM           Index LA diam:        4.00 cm 1.96 cm/m   RA Area:     11.50 cm LA Vol (A2C):   33.0 ml 16.15 ml/m  RA Volume:   26.10 ml  12.77 ml/m LA Vol (A4C):   33.4 ml 16.34 ml/m LA Biplane Vol: 34.1 ml 16.69 ml/m  AORTIC VALVE LVOT Vmax:   119.50 cm/s LVOT Vmean:  75.550 cm/s LVOT VTI:    0.248 m  AORTA Ao Root diam: 3.40 cm Ao Asc diam:  3.10 cm MITRAL VALVE MV Area (PHT): 2.90 cm    SHUNTS MV Decel Time: 262 msec    Systemic VTI:  0.25 m MV E velocity: 65.25 cm/s  Systemic Diam: 2.00 cm MV A velocity:  96.95 cm/s MV E/A ratio:  0.67 Deatrice Cage MD Electronically signed by Deatrice Cage MD Signature Date/Time: 06/01/2024/7:29:21 AM    Final    US  Carotid Bilateral Result Date: 05/31/2024 CLINICAL DATA:  Dizziness and syncope. History of hypertension and hyperlipidemia. EXAM: BILATERAL CAROTID DUPLEX ULTRASOUND TECHNIQUE: [place]nor scale imaging, color Doppler and duplex ultrasound were performed of bilateral carotid and vertebral arteries in the neck. COMPARISON:  None Available. FINDINGS: Criteria: Quantification of carotid stenosis is based on velocity parameters that correlate the residual internal carotid diameter with NASCET-based stenosis levels, using the diameter of the distal internal carotid lumen as the denominator for stenosis measurement. The following velocity measurements were obtained: RIGHT ICA:  103/11 cm/sec CCA:  161/13 cm/sec  SYSTOLIC ICA/CCA RATIO:  0.6 ECA:  180 cm/sec LEFT ICA:  82/15 cm/sec CCA:  270/19 cm/sec SYSTOLIC ICA/CCA RATIO:  0.3 ECA:  139 cm/sec RIGHT CAROTID ARTERY: Mild plaque in the distal carotid bulb and proximal right ICA. Estimated right ICA stenosis is less than 50%. RIGHT VERTEBRAL ARTERY: Antegrade flow with normal waveform and velocity. LEFT CAROTID ARTERY: Significant noncalcified plaque in the mid common carotid artery in the neck with elevated velocities. Stenosis may be 70% or greater based on velocities. Mild plaque at the level of the proximal left ICA. Estimated left ICA stenosis is less than 50%. LEFT VERTEBRAL ARTERY: Antegrade flow with normal waveform and velocity. Incidental 0.8 cm solid left and 1.6 cm cystic right thyroid nodules. IMPRESSION: 1. Significant noncalcified plaque in the mid left common carotid artery in the neck with elevated velocities. Stenosis may be 70% or greater based on velocities. Correlation with CT angiography of the neck may be helpful. 2. Mild plaque in the distal right carotid bulb and proximal right ICA. Estimated right ICA  stenosis is less than 50%. 3. Mild plaque at the level of the proximal left ICA. Estimated left ICA stenosis is less than 50%. 4. Incidental 0.8 cm solid left and 1.6 cm cystic right thyroid nodules. Electronically Signed   By: Marcey Moan M.D.   On: 05/31/2024 15:20   DG Chest 2 View Result Date: 05/26/2024 CLINICAL DATA:  Shortness of breath.  Dizziness and chest tightness. EXAM: CHEST - 2 VIEW COMPARISON:  10/09/2015 FINDINGS: The cardiomediastinal contours are normal. Mild hyperinflation with chronic bronchial thickening. Pulmonary vasculature is normal. No consolidation, pleural effusion, or pneumothorax. No acute osseous abnormalities are seen. IMPRESSION: Mild hyperinflation and chronic bronchial thickening. Electronically Signed   By: Andrea Gasman M.D.   On: 05/26/2024 15:54    Labs Recent Results (from the past 2160 hours)  Basic metabolic panel     Status: Abnormal   Collection Time: 05/26/24  2:48 PM  Result Value Ref Range   Sodium 122 (L) 135 - 145 mmol/L   Potassium 4.8 3.5 - 5.1 mmol/L   Chloride 90 (L) 98 - 111 mmol/L   CO2 21 (L) 22 - 32 mmol/L   Glucose, Bld 101 (H) 70 - 99 mg/dL    Comment: Glucose reference range applies only to samples taken after fasting for at least 8 hours.   BUN 11 8 - 23 mg/dL   Creatinine, Ser 9.12 0.61 - 1.24 mg/dL   Calcium 9.3 8.9 - 89.6 mg/dL   GFR, Estimated >39 >39 mL/min    Comment: (NOTE) Calculated using the CKD-EPI Creatinine Equation (2021)    Anion gap 11 5 - 15    Comment: Performed at [facility], [address] [place], [place] [address]  CBC     Status: None   Collection Time: 05/26/24  2:48 PM  Result Value Ref Range   WBC 9.1 4.0 - 10.5 K/uL   RBC 5.17 4.22 - 5.81 MIL/uL   Hemoglobin 14.6 13.0 - 17.0 g/dL   HCT 57.5 60.9 - 47.9 %   MCV 82.0 80.0 - 100.0 fL   MCH 28.2 26.0 - 34.0 pg   MCHC 34.4 30.0 - 36.0 g/dL   RDW 86.5 88.4 - 84.4 %   Platelets 235 150 - 400 K/uL   nRBC 0.0 0.0 - 0.2 %     Comment: Performed at [facility], [address] [place], [place] [address]  Sodium, urine, random  Status: None   Collection Time: 05/27/24 12:25 AM  Result Value Ref Range   Sodium, Ur 39 mmol/L    Comment: Performed at [facility], [address] [place], [place] [address]  Osmolality, urine     Status: None   Collection Time: 05/27/24 12:25 AM  Result Value Ref Range   Osmolality, Ur 344 300 - 900 mOsm/kg    Comment: REPEATED TO VERIFY Performed at [facility], [address] [place], [place] [address]   Basic metabolic panel     Status: Abnormal   Collection Time: 05/27/24  5:02 AM  Result Value Ref Range   Sodium 124 (L) 135 - 145 mmol/L   Potassium 4.2 3.5 - 5.1 mmol/L   Chloride 94 (L) 98 - 111 mmol/L   CO2 22 22 - 32 mmol/L   Glucose, Bld 128 (H) 70 - 99 mg/dL    Comment: Glucose reference range applies only to samples taken after fasting for at least 8 hours.   BUN 14 8 - 23 mg/dL   Creatinine, Ser 9.21 0.61 - 1.24 mg/dL   Calcium 8.9 8.9 - 89.6 mg/dL   GFR, Estimated >39 >39 mL/min    Comment: (NOTE) Calculated using the CKD-EPI Creatinine Equation (2021)    Anion gap 8 5 - 15    Comment: Performed at [facility], [address] [place], [place] [address]  CBC     Status: None   Collection Time: 05/27/24  5:02 AM  Result Value Ref Range   WBC 6.2 4.0 - 10.5 K/uL   RBC 5.03 4.22 - 5.81 MIL/uL   Hemoglobin 14.3 13.0 - 17.0 g/dL   HCT 58.9 60.9 - 47.9 %   MCV 81.5 80.0 - 100.0 fL   MCH 28.4 26.0 - 34.0 pg   MCHC 34.9 30.0 - 36.0 g/dL   RDW 86.7 88.4 - 84.4 %   Platelets 204 150 - 400 K/uL   nRBC 0.0 0.0 - 0.2 %    Comment: Performed at [facility], [address] [place], [place] [address]  Osmolality     Status: Abnormal   Collection Time: 05/27/24  5:02 AM  Result Value Ref Range   Osmolality 257 (L) 275 - 295 mOsm/kg    Comment: REPEATED TO VERIFY Performed at [facility] [address] [place], [place] [address]   Brain natriuretic peptide     Status: Abnormal   Collection Time: 05/27/24  5:02 AM  Result Value Ref Range   B Natriuretic Peptide 130.0 (H) 0.0 - 100.0 pg/mL    Comment: Performed at [facility], [address] [place], [place] [address]  Basic metabolic panel     Status: Abnormal   Collection Time: 05/28/24  3:24 AM  Result Value Ref Range   Sodium 122 (L) 135 - 145 mmol/L   Potassium 3.7 3.5 - 5.1 mmol/L   Chloride 96 (L) 98 - 111 mmol/L   CO2 20 (L) 22 - 32 mmol/L   Glucose, Bld 98 70 - 99 mg/dL    Comment: Glucose reference range applies only to samples taken after fasting for at least 8 hours.   BUN 14 8 - 23 mg/dL   Creatinine, Ser 9.11 0.61 - 1.24 mg/dL   Calcium 8.7 (L) 8.9 - 10.3 mg/dL   GFR, Estimated >39 >39 mL/min    Comment: (NOTE) Calculated using the CKD-EPI Creatinine Equation (2021)    Anion gap 6 5 - 15  Comment: Performed at [facility], [address] [place], [place] [address]  Basic metabolic panel     Status: Abnormal   Collection Time: 05/29/24  5:39 AM  Result Value Ref Range   Sodium 132 (L) 135 - 145 mmol/L   Potassium 3.9 3.5 - 5.1 mmol/L   Chloride 99 98 - 111 mmol/L   CO2 24 22 - 32 mmol/L   Glucose, Bld 93 70 - 99 mg/dL    Comment: Glucose reference range applies only to samples taken after fasting for at least 8 hours.   BUN 16 8 - 23 mg/dL   Creatinine, Ser 9.00 0.61 - 1.24 mg/dL   Calcium 9.1 8.9 - 89.6 mg/dL   GFR, Estimated >39 >39 mL/min    Comment: (NOTE) Calculated using the CKD-EPI Creatinine Equation (2021)    Anion gap 9 5 - 15    Comment: Performed at [facility], [address] [place], [place] [address]  Basic metabolic panel     Status: Abnormal   Collection Time: 05/31/24  4:26 AM  Result Value Ref Range   Sodium 133 (L) 135 - 145 mmol/L   Potassium 4.2 3.5 - 5.1 mmol/L   Chloride 101 98 - 111 mmol/L   CO2 25 22 - 32 mmol/L   Glucose, Bld 85  70 - 99 mg/dL    Comment: Glucose reference range applies only to samples taken after fasting for at least 8 hours.   BUN 16 8 - 23 mg/dL   Creatinine, Ser 9.05 0.61 - 1.24 mg/dL   Calcium 8.9 8.9 - 89.6 mg/dL   GFR, Estimated >39 >39 mL/min    Comment: (NOTE) Calculated using the CKD-EPI Creatinine Equation (2021)    Anion gap 7 5 - 15    Comment: Performed at [facility], [address] [place], [place] [address]  Magnesium     Status: None   Collection Time: 05/31/24  4:26 AM  Result Value Ref Range   Magnesium 2.4 1.7 - 2.4 mg/dL    Comment: Performed at [facility], [address] [place], [place] [address]  ECHOCARDIOGRAM COMPLETE     Status: None   Collection Time: 05/31/24  8:29 PM  Result Value Ref Range   Weight 3,280 oz   Height 67 in   BP 150/63 mmHg   S' Lateral 3.20 cm   Area-P 1/2 2.90 cm2   Est EF 55 - 60%   Lipid panel     Status: Abnormal   Collection Time: 06/01/24  9:47 AM  Result Value Ref Range   Cholesterol 181 0 - 200 mg/dL   Triglycerides 804 (H) <150 mg/dL   HDL 51 >59 mg/dL   Total CHOL/HDL Ratio 3.5 RATIO   VLDL 39 0 - 40 mg/dL   LDL Cholesterol 91 0 - 99 mg/dL    Comment:        Total Cholesterol/HDL:CHD Risk Coronary Heart Disease Risk Table                     Men   Women  1/2 Average Risk   3.4   3.3  Average Risk       5.0   4.4  2 X Average Risk   9.6   7.1  3 X Average Risk  23.4   11.0        Use the calculated Patient Ratio above and the CHD Risk Table to determine the patient's CHD Risk.  ATP III CLASSIFICATION (LDL):  <100     mg/dL   Optimal  899-870  mg/dL   Near or Above                    Optimal  130-159  mg/dL   Borderline  839-810  mg/dL   High  >809     mg/dL   Very High Performed at [facility], [address] [place], [place] [address]   Basic metabolic panel     Status: Abnormal   Collection Time: 06/01/24  9:47 AM  Result Value Ref Range   Sodium 134 (L) 135 - 145  mmol/L   Potassium 3.4 (L) 3.5 - 5.1 mmol/L   Chloride 98 98 - 111 mmol/L   CO2 25 22 - 32 mmol/L   Glucose, Bld 119 (H) 70 - 99 mg/dL    Comment: Glucose reference range applies only to samples taken after fasting for at least 8 hours.   BUN 19 8 - 23 mg/dL   Creatinine, Ser 8.92 0.61 - 1.24 mg/dL   Calcium 9.2 8.9 - 89.6 mg/dL   GFR, Estimated >39 >39 mL/min    Comment: (NOTE) Calculated using the CKD-EPI Creatinine Equation (2021)    Anion gap 11 5 - 15    Comment: Performed at [facility], [address] [place], [place] [address]    Assessment/Plan:  Stenosis of left carotid artery he had a carotid duplex which suggested greater than 70% stenosis in his left carotid artery.  This was followed by CT angiogram which was reported as a 50% stenosis in the left common carotid artery.  I have independently reviewed the CT scan and my interpretation would be between these 2 levels.  Although I think it is worse than 50%, I do think it is less than 70%.  I have estimated this in about 60% stenosis in the left common carotid artery.  Minimal right carotid disease was present. We had a discussion about these findings today.  I have offered him 2 options for further evaluation and treatment.  1 would be increasing medical therapy by adding antiplatelet therapy to his Repatha  and a short interval follow-up of about 3 months with duplex.  The other would be a catheter-based angiogram with intention to treat if there is indeed a 70% or greater stenosis.  This would be done in conjunction with addition of an antiplatelet therapy.  He would prefer the short interval follow-up which I think is very reasonable.  I will plan to see him back in 3 months.  I will send in a prescription for Plavix .  Hypertension blood pressure control important in reducing the progression of atherosclerotic disease. On appropriate oral medications.   Hyperlipidemia On repatha . lipid control important in  reducing the progression of atherosclerotic disease.    Abdominal bruit Will plan an aortoiliac duplex at his next visit.      Selinda Gu 06/14/2024, 10:36 AM   This note was created with Dragon medical transcription system.  Any errors from dictation are unintentional.

## 2024-06-14 NOTE — Assessment & Plan Note (Signed)
 Will plan an aortoiliac duplex at his next visit.

## 2024-06-14 NOTE — Assessment & Plan Note (Signed)
 he had a carotid duplex which suggested greater than 70% stenosis in his left carotid artery.  This was followed by CT angiogram which was reported as a 50% stenosis in the left common carotid artery.  I have independently reviewed the CT scan and my interpretation would be between these 2 levels.  Although I think it is worse than 50%, I do think it is less than 70%.  I have estimated this in about 60% stenosis in the left common carotid artery.  Minimal right carotid disease was present. We had a discussion about these findings today.  I have offered him 2 options for further evaluation and treatment.  1 would be increasing medical therapy by adding antiplatelet therapy to his Repatha  and a short interval follow-up of about 3 months with duplex.  The other would be a catheter-based angiogram with intention to treat if there is indeed a 70% or greater stenosis.  This would be done in conjunction with addition of an antiplatelet therapy.  He would prefer the short interval follow-up which I think is very reasonable.  I will plan to see him back in 3 months.  I will send in a prescription for Plavix .

## 2024-06-14 NOTE — Assessment & Plan Note (Signed)
 On repatha . lipid control important in reducing the progression of atherosclerotic disease.

## 2024-06-14 NOTE — Assessment & Plan Note (Signed)
 blood pressure control important in reducing the progression of atherosclerotic disease. On appropriate oral medications.

## 2024-06-14 NOTE — Patient Instructions (Signed)
 Carotid Artery Disease  The carotid arteries are the two main blood vessels on either side of the neck. They send blood to the brain, other parts of the head, and the neck. Carotid artery disease happens when one or both of the carotid arteries get narrow or blocked. This condition is also called carotid artery stenosis. This condition increases your risk for a stroke or a transient ischemic attack (TIA). A TIA is a "mini-stroke" that causes stroke-like symptoms that go away quickly. What are the causes? This condition is mainly caused by a narrowing and hardening of the carotid arteries. The carotid arteries can become narrow or clogged with a buildup of plaque. Plaque includes: Fat. Cholesterol. Calcium. Other substances. What increases the risk? Having certain medical conditions, such as: High cholesterol. High blood pressure. Diabetes. Being very overweight (obese). Smoking. A family history of cardiovascular disease. Not being active or not exercising. Being male. People who are male have a higher risk of having arteries become narrow and harden earlier in life than people who are male. Being male and older than 69 years old. Being male and older than 69 years old. What are the signs or symptoms? This condition may not have any signs or symptoms until a stroke or TIA happens. In some cases, your doctor may be able to hear a whooshing sound with a stethoscope. This can mean a change in blood flow caused by plaque buildup. How is this treated? This condition may be treated with more than one treatment. Treatment may include: Lifestyle changes, such as: Quitting smoking. Getting regular exercise as told by your doctor. Eating a healthy diet. Managing stress. Getting to and staying at a healthy weight. Medicines to control: Blood pressure. Cholesterol. Blood clotting. Surgery. You may have: A surgery to remove the blockages in the carotid arteries. A procedure in which a  small mesh tube (stent) is used to widen the blocked carotid arteries. Follow these instructions at home: Eating and drinking Follow instructions from your doctor about what you may eat and drink. It is important to: Eat a healthy diet that includes: A lot of fresh fruits and vegetables. Low-fat (lean) meats. Avoid these foods: Foods that are high in fat. Foods that are high in salt (sodium). Foods that are fried. Foods that are processed. Foods that have few good nutrients (poor nutritional value).  Lifestyle  Keep a healthy weight. Do exercises as told by your doctor to stay active. Each week, you should get one of the following: At least 150 minutes of exercise that raises your heart rate and makes you sweat. At least 75 minutes of exercise that takes a lot of effort. Do not smoke or use any products that contain nicotine or tobacco. If you need help quitting, ask your doctor. Do not drink alcohol if: Your doctor tells you not to drink. You are pregnant, may be pregnant, or are planning to become pregnant. If you drink alcohol: Limit how much you have to: 0-1 drink a day for women. 0-2 drinks a day for men. Know how much alcohol is in your drink. In the [place] one drink equals one 12 oz bottle of beer (355 mL), one 5 oz glass of wine (148 mL), or one 1 oz glass of hard liquor (44 mL). Do not use drugs. Manage your stress. Ask your doctor for tips on how to do this. General instructions Take over-the-counter and prescription medicines only as told by your doctor. Keep all follow-up visits. Your doctor will watch  your condition and may need to change your treatment plan over time. Where to find more information American Heart Association: heart.org Get help right away if: You have any signs of a stroke. "BE FAST" is an easy way to remember the main warning signs: B - Balance. Signs are dizziness, sudden trouble walking, or loss of balance. E - Eyes. Signs are trouble seeing or  a change in how you see. F - Face. Signs are sudden weakness or loss of feeling of the face, or the face or eyelid drooping on one side. A - Arms. Signs are weakness or loss of feeling in an arm. This happens suddenly and usually on one side of the body. S - Speech. Signs are sudden trouble speaking, slurred speech, or trouble understanding what people say. T - Time. Time to call emergency services. Write down what time symptoms started. You have other signs of a stroke, such as: A sudden, very bad headache with no known cause. Feeling like you may vomit (nausea). Vomiting. A seizure. These symptoms may be an emergency. Get help right away. Call 911. Do not wait to see if the symptoms will go away. Do not drive yourself to the hospital. This information is not intended to replace advice given to you by your health care provider. Make sure you discuss any questions you have with your health care provider. Document Revised: 03/18/2022 Document Reviewed: 03/18/2022 Elsevier Patient Education  2024 [employer].

## 2024-06-15 ENCOUNTER — Telehealth: Payer: Self-pay | Admitting: Pulmonary Disease

## 2024-06-15 NOTE — Telephone Encounter (Signed)
 LVMTCB to schedule pulmonary consult.

## 2024-06-23 ENCOUNTER — Encounter: Payer: Self-pay | Admitting: Internal Medicine

## 2024-06-28 ENCOUNTER — Ambulatory Visit (INDEPENDENT_AMBULATORY_CARE_PROVIDER_SITE_OTHER): Admitting: Internal Medicine

## 2024-06-28 ENCOUNTER — Encounter: Payer: Self-pay | Admitting: Internal Medicine

## 2024-06-28 VITALS — BP 116/80 | HR 86 | Temp 98.5°F | Ht 67.0 in | Wt 203.0 lb

## 2024-06-28 DIAGNOSIS — R0602 Shortness of breath: Secondary | ICD-10-CM

## 2024-06-28 DIAGNOSIS — R0789 Other chest pain: Secondary | ICD-10-CM

## 2024-06-28 DIAGNOSIS — J449 Chronic obstructive pulmonary disease, unspecified: Secondary | ICD-10-CM | POA: Diagnosis not present

## 2024-06-28 DIAGNOSIS — E871 Hypo-osmolality and hyponatremia: Secondary | ICD-10-CM | POA: Diagnosis not present

## 2024-06-28 MED ORDER — FLUTICASONE-SALMETEROL 230-21 MCG/ACT IN AERO: 2.0000 | INHALATION_SPRAY | Freq: Two times a day (BID) | RESPIRATORY_TRACT | 12 refills | Status: DC

## 2024-06-28 NOTE — Patient Instructions (Signed)
 After further evaluation I do believe you have a diagnosis of COPD however will need further testing with pulmonary function test to assess your lungs Lets plan to obtain CT of your chest to get a better view of your lungs  Start Advair 2 puffs in the morning 2 puffs at night Please rinse mouth after use  Avoid Allergens and Irritants Avoid secondhand smoke Avoid SICK contacts Recommend  Masking  when appropriate Recommend Keep up-to-date with vaccinations

## 2024-06-28 NOTE — Progress Notes (Signed)
 [facility] Pulmonary Medicine Consultation      Date: 06/28/2024,   MRN# [id number] James [name] 1956-11-01   CHIEF COMPLAINT:    Assessment of COPD Patient with hyponatremia and evidence of shortness of breath  HISTORY OF PRESENT ILLNESS   68 year old male with no prior diagnosis of COPD presents emergency department for chief concerns of shortness of breath.   Vitals in the ED showed temperature of 99, respiration rate 22, heart rate 88, blood pressure 168/89, SpO2 98% on room air. Serum sodium is 122   ED treatment: DuoNebs x 2, Solu-Medrol  125 mg IV x 2, sodium chloride  500 mL liter bolus.   Patient was admitted and discharged Cardiology was consulted Patient is here for assessment for COPD   Chest x-ray July 2025 reviewed in detail today Findings suggest hyperinflated lungs with flattened diaphragms Probable underlying obstructive lung disease     Assessment of Dizziness No orthostatic blood pressure changes Presumed to be medication induced as patient is on beta blockers which can cause bradycardia Carvedilol  has been discontinued since patient's heart rate has been in the low 50s 2D echocardiogram showed a normal LVEF of 55 to 60% with normal LV function and grade 1 diastolic dysfunction. Carotid Doppler showed significant noncalcified plaque in the mid left common carotid artery in the neck with elevated velocities. Stenosis may be 70% or less.  Greater based on velocities.   Correlation with CT angiography of the neck may be helpful. Mild plaque in the distal right carotid bulb and proximal right ICA. Estimated right ICA stenosis is less than 50%.  MRI of the brain was negative for an acute stroke CT angiogram showed low-density soft plaque in the distal Left CCA, with 50% stenosis. No hemodynamically significant arterial stenosis.  Follow-up with vascular surgery as an outpatient PAST MEDICAL HISTORY   Past Medical History:  Diagnosis Date   Anxiety     history of   Depression    history of    Eyes sensitive to light    due to side effect of manufactured version of Gabapentin    Family history of colonic polyps    Hand pain, right    History of palpitations    intermittent; patient states heart skips a beat at times   Hyperlipidemia    Hypertension 2007   Insomnia    Shoulder injury 1976   Special screening for malignant neoplasms, colon    Spinal stenosis of lumbar region      SURGICAL HISTORY   Past Surgical History:  Procedure Laterality Date   CARDIAC CATHETERIZATION  2007   COLONOSCOPY  2013   KNEE SURGERY Left 1979   POLYPECTOMY  2013   TONSILLECTOMY  1964     FAMILY HISTORY   Family History  Problem Relation Age of Onset   Colon cancer Father    Cancer Father        colon   Transient ischemic attack Father        history of   Arthritis Mother    Heart failure Mother    Hypertension Mother    Kidney failure Mother      SOCIAL HISTORY   Social History   Tobacco Use   Smoking status: Former    Current packs/day: 1.00    Average packs/day: 1 pack/day for 25.0 years (25.0 ttl pk-yrs)    Types: Cigarettes   Smokeless tobacco: Never  Vaping Use   Vaping status: Never Used  Substance Use Topics   Alcohol  use: No    Alcohol/week: 0.0 standard drinks of alcohol   Drug use: No     MEDICATIONS    Home Medication:  Current Outpatient Rx   Order #: [id number] Class: Normal   Order #: [id number] Class: Print   Order #: [id number] Class: Normal   Order #: [id number] Class: Normal   Order #: [id number] Class: Normal   Order #: [id number] Class: Normal   Order #: [id number] Class: Normal    Current Medication:  Current Outpatient Medications:    albuterol  (VENTOLIN  HFA) 108 (90 Base) MCG/ACT inhaler, Inhale 2 puffs into the lungs every 6 (six) hours as needed for wheezing or shortness of breath., Disp: 18 g, Rfl: 0   ALPRAZolam  (XANAX ) 0.5 MG tablet, Take 1 tablet (0.5 mg total) 3 (three) times daily as  needed by mouth for anxiety., Disp: 90 tablet, Rfl: 2   amLODipine  (NORVASC ) 10 MG tablet, Take 1 tablet (10 mg total) by mouth daily., Disp: 30 tablet, Rfl: 11   clopidogrel  (PLAVIX ) 75 MG tablet, Take 1 tablet (75 mg total) by mouth daily., Disp: 30 tablet, Rfl: 6   Evolocumab  (REPATHA ) 140 MG/ML SOSY, Inject 140 mg into the skin every 14 (fourteen) days., Disp: 2.1 mL, Rfl: 0   fluticasone  furoate-vilanterol (BREO ELLIPTA ) 200-25 MCG/ACT AEPB, Inhale 1 puff into the lungs daily., Disp: 60 each, Rfl: 0   polyethylene glycol powder (GLYCOLAX /MIRALAX ) 17 GM/SCOOP powder, Take 17 g by mouth daily., Disp: 238 g, Rfl: 0    ALLERGIES   Naprosyn [naproxen]  BP 116/80   Pulse 86   Temp 98.5 F (36.9 C)   Ht 5' 7 (1.702 m)   Wt 203 lb (92.1 kg)   SpO2 98%   BMI 31.79 kg/m      Review of Systems: Gen:  Denies  fever, sweats, chills weight loss  HEENT: Denies blurred vision, double vision, ear pain, eye pain, hearing loss, nose bleeds, sore throat Cardiac:  No dizziness, chest pain or heaviness, chest tightness,edema, No JVD Resp:   No cough, -sputum production, +shortness of breath,-wheezing, -hemoptysis,  Other:  All other systems negative   Physical Examination:   General Appearance: No distress  EYES PERRLA, EOM intact.   NECK Supple, No JVD Pulmonary: normal breath sounds, No wheezing.  CardiovascularNormal S1,S2.  No m/r/g.   Abdomen: Benign, Soft, non-tender. Neurology UE/LE 5/5 strength, no focal deficits Ext pulses intact, cap refill intact ALL OTHER ROS ARE NEGATIVE      IMAGING    MR BRAIN WO CONTRAST Result Date: 06/02/2024 CLINICAL DATA:  Neuro deficit, acute, stroke suspected EXAM: MRI HEAD WITHOUT CONTRAST TECHNIQUE: Multiplanar, multiecho pulse sequences of the brain and surrounding structures were obtained without intravenous contrast. COMPARISON:  None Available. FINDINGS: Brain: No acute infarction, hemorrhage, hydrocephalus, extra-axial collection or  mass lesion. Vascular: Major arterial flow voids are maintained at the skull base. Skull and upper cervical spine: Normal marrow signal. Sinuses/Orbits: Negative. Other: None. IMPRESSION: No evidence of acute intracranial abnormality. Electronically Signed   By: Gilmore GORMAN Molt M.D.   On: 06/02/2024 01:35   CT ANGIO HEAD NECK W WO CM Result Date: 06/01/2024 CLINICAL DATA:  68 year old male with shortness of breath, neurologic deficit. EXAM: CT ANGIOGRAPHY HEAD AND NECK WITH AND WITHOUT CONTRAST TECHNIQUE: Multidetector CT imaging of the head and neck was performed using the standard protocol during bolus administration of intravenous contrast. Multiplanar CT image reconstructions and MIPs were obtained to evaluate the vascular anatomy. Carotid stenosis measurements (when applicable) are obtained utilizing NASCET criteria, using the  distal internal carotid diameter as the denominator. RADIATION DOSE REDUCTION: This exam was performed according to the departmental dose-optimization program which includes automated exposure control, adjustment of the mA and/or kV according to patient size and/or use of iterative reconstruction technique. CONTRAST:  75mL OMNIPAQUE  IOHEXOL  350 MG/ML SOLN COMPARISON:  None Available. FINDINGS: CT HEAD Brain: No midline shift, ventriculomegaly, mass effect, evidence of mass lesion, intracranial hemorrhage or evidence of cortically based acute infarction. Gray-white matter differentiation is within normal limits throughout the brain. Cerebral volume is within normal limits for age. Calvarium and skull base: Intact, negative. Paranasal sinuses: Visualized paranasal sinuses and mastoids are clear. Orbits: Visualized orbits and scalp soft tissues are within normal limits. CTA NECK Skeleton: Ordinary cervical spine degeneration. No acute osseous abnormality identified. Upper chest: Centrilobular emphysema. Visible upper lungs well aerated, mild apical scarring. Negative visible superior  mediastinum. Other neck: Subcentimeter bilateral thyroid nodules (no follow-up imaging recommended). Other nonvascular neck soft tissue spaces are within normal limits. Aortic arch: Calcified aortic atherosclerosis.  3 vessel arch. Right carotid system: Brachiocephalic origin soft more so than calcified plaque without stenosis. Negative right CCA origin. Mild soft plaque in the right CCA before the bifurcation (series 8, image 113) without stenosis. Similar mild soft plaque at the bifurcation and proximal right ICA. Tortuosity of the right ICA below the skull base with no stenosis. Left carotid system: Similar mild soft plaque of the proximal left CCA without stenosis. Distal left CCA moderate, bulky low-density soft plaque along the medial vessel series 8, image 114. Up to 50 % stenosis with respect to the distal vessel. Left carotid bifurcation is patent with mild soft plaque. Tortuous left ICA below the skull base. No other stenosis. Vertebral arteries: Mild proximal right subclavian artery plaque without stenosis. Normal right vertebral artery origin. Patent right vertebral artery to the skull base without stenosis. Proximal left subclavian artery soft plaque without stenosis. Normal left vertebral artery origin. Non dominant left vertebral artery is patent to the skull base without stenosis. CTA HEAD Posterior circulation: Patent distal vertebral arteries and vertebrobasilar junction with no significant plaque or stenosis. Patent PICA origins. Patent and fenestrated (normal variant) proximal basilar artery. Patent distal basilar artery, SCA and PCA origins. Posterior communicating arteries are diminutive or absent. Bilateral PCA branches are within normal limits. Anterior circulation: Both ICA siphons are patent with mild for age atherosclerosis. No significant siphon stenosis. Mostly calcified siphon plaque. Patent carotid termini. Patent MCA and ACA origins. Normal anterior communicating artery. Bilateral ACA  branches are within normal limits. Left MCA M1 segment and trifurcation patent without stenosis. Right MCA M1 segment is tortuous, patent right MCA trifurcation without stenosis. Bilateral MCA branches are within normal limits. Venous sinuses: Patent. Anatomic variants: Dominant right vertebral artery. Fenestrated proximal basilar artery. Review of the MIP images confirms the above findings IMPRESSION: 1. CTA is negative for large vessel occlusion. Normal for age CT appearance of the brain. 2. Generally mild for age atherosclerosis in the head and neck but bulky low-density soft plaque in the distal Left CCA, with 50% stenosis. No hemodynamically significant arterial stenosis. Aortic Atherosclerosis (ICD10-I70.0) 3. Emphysema (ICD10-J43.9). Electronically Signed   By: VEAR Hurst M.D.   On: 06/01/2024 11:51   ECHOCARDIOGRAM COMPLETE Result Date: 06/01/2024    ECHOCARDIOGRAM REPORT   Patient Name:   JACY HOWAT Date of Exam: 05/31/2024 Medical Rec #:  [id number]      Height:       67.0 in Accession #:  [id number]     Weight:       205.0 lb Date of Birth:  Jul 13, 1956       BSA:          2.044 m Patient Age:    68 years       BP:           168/89 mmHg Patient Gender: M              HR:           66 bpm. Exam Location:  [facility] Procedure: 2D Echo, Cardiac Doppler and Color Doppler (Both Spectral and Color            Flow Doppler were utilized during procedure). Indications:     Dizziness  History:         Patient has prior history of Echocardiogram examinations, most                  recent 12/18/2015. Risk Factors:Former Smoker, Hypertension and                  Dyslipidemia.  Sonographer:     Carl Coma RDCS Referring Phys:  [id number] [facility] L CAREY Diagnosing Phys: Deatrice Cage MD IMPRESSIONS  1. Left ventricular ejection fraction, by estimation, is 55 to 60%. The left ventricle has normal function. The left ventricle has no regional wall motion abnormalities. Left ventricular diastolic parameters are  consistent with Grade I diastolic dysfunction (impaired relaxation).  2. Right ventricular systolic function is normal. The right ventricular size is normal. Tricuspid regurgitation signal is inadequate for assessing PA pressure.  3. The mitral valve is normal in structure. No evidence of mitral valve regurgitation. No evidence of mitral stenosis.  4. The aortic valve is normal in structure. Aortic valve regurgitation is not visualized. No aortic stenosis is present.  5. The inferior vena cava is normal in size with greater than 50% respiratory variability, suggesting right atrial pressure of 3 mmHg. FINDINGS  Left Ventricle: Left ventricular ejection fraction, by estimation, is 55 to 60%. The left ventricle has normal function. The left ventricle has no regional wall motion abnormalities. The left ventricular internal cavity size was normal in size. There is  no left ventricular hypertrophy. Left ventricular diastolic parameters are consistent with Grade I diastolic dysfunction (impaired relaxation). Right Ventricle: The right ventricular size is normal. No increase in right ventricular wall thickness. Right ventricular systolic function is normal. Tricuspid regurgitation signal is inadequate for assessing PA pressure. Left Atrium: Left atrial size was normal in size. Right Atrium: Right atrial size was normal in size. Pericardium: There is no evidence of pericardial effusion. Mitral Valve: The mitral valve is normal in structure. No evidence of mitral valve regurgitation. No evidence of mitral valve stenosis. Tricuspid Valve: The tricuspid valve is normal in structure. Tricuspid valve regurgitation is not demonstrated. No evidence of tricuspid stenosis. Aortic Valve: The aortic valve is normal in structure. Aortic valve regurgitation is not visualized. No aortic stenosis is present. Pulmonic Valve: The pulmonic valve was normal in structure. Pulmonic valve regurgitation is not visualized. No evidence of pulmonic  stenosis. Aorta: The aortic root is normal in size and structure. Venous: The inferior vena cava is normal in size with greater than 50% respiratory variability, suggesting right atrial pressure of 3 mmHg. IAS/Shunts: No atrial level shunt detected by color flow Doppler.  LEFT VENTRICLE PLAX 2D LVIDd:         5.30 cm   Diastology LVIDs:  3.20 cm   LV e' medial:    7.44 cm/s LV PW:         0.80 cm   LV E/e' medial:  8.8 LV IVS:        0.60 cm   LV e' lateral:   9.78 cm/s LVOT diam:     2.00 cm   LV E/e' lateral: 6.7 LV SV:         78 LV SV Index:   38 LVOT Area:     3.14 cm  RIGHT VENTRICLE             IVC RV Basal diam:  3.60 cm     IVC diam: 1.30 cm RV S prime:     17.50 cm/s TAPSE (M-mode): 2.9 cm LEFT ATRIUM             Index        RIGHT ATRIUM           Index LA diam:        4.00 cm 1.96 cm/m   RA Area:     11.50 cm LA Vol (A2C):   33.0 ml 16.15 ml/m  RA Volume:   26.10 ml  12.77 ml/m LA Vol (A4C):   33.4 ml 16.34 ml/m LA Biplane Vol: 34.1 ml 16.69 ml/m  AORTIC VALVE LVOT Vmax:   119.50 cm/s LVOT Vmean:  75.550 cm/s LVOT VTI:    0.248 m  AORTA Ao Root diam: 3.40 cm Ao Asc diam:  3.10 cm MITRAL VALVE MV Area (PHT): 2.90 cm    SHUNTS MV Decel Time: 262 msec    Systemic VTI:  0.25 m MV E velocity: 65.25 cm/s  Systemic Diam: 2.00 cm MV A velocity: 96.95 cm/s MV E/A ratio:  0.67 Deatrice Cage MD Electronically signed by Deatrice Cage MD Signature Date/Time: 06/01/2024/7:29:21 AM    Final    US  Carotid Bilateral Result Date: 05/31/2024 CLINICAL DATA:  Dizziness and syncope. History of hypertension and hyperlipidemia. EXAM: BILATERAL CAROTID DUPLEX ULTRASOUND TECHNIQUE: Elnor scale imaging, color Doppler and duplex ultrasound were performed of bilateral carotid and vertebral arteries in the neck. COMPARISON:  None Available. FINDINGS: Criteria: Quantification of carotid stenosis is based on velocity parameters that correlate the residual internal carotid diameter with NASCET-based stenosis levels,  using the diameter of the distal internal carotid lumen as the denominator for stenosis measurement. The following velocity measurements were obtained: RIGHT ICA:  103/11 cm/sec CCA:  161/13 cm/sec SYSTOLIC ICA/CCA RATIO:  0.6 ECA:  180 cm/sec LEFT ICA:  82/15 cm/sec CCA:  270/19 cm/sec SYSTOLIC ICA/CCA RATIO:  0.3 ECA:  139 cm/sec RIGHT CAROTID ARTERY: Mild plaque in the distal carotid bulb and proximal right ICA. Estimated right ICA stenosis is less than 50%. RIGHT VERTEBRAL ARTERY: Antegrade flow with normal waveform and velocity. LEFT CAROTID ARTERY: Significant noncalcified plaque in the mid common carotid artery in the neck with elevated velocities. Stenosis may be 70% or greater based on velocities. Mild plaque at the level of the proximal left ICA. Estimated left ICA stenosis is less than 50%. LEFT VERTEBRAL ARTERY: Antegrade flow with normal waveform and velocity. Incidental 0.8 cm solid left and 1.6 cm cystic right thyroid nodules. IMPRESSION: 1. Significant noncalcified plaque in the mid left common carotid artery in the neck with elevated velocities. Stenosis may be 70% or greater based on velocities. Correlation with CT angiography of the neck may be helpful. 2. Mild plaque in the distal right carotid bulb and proximal right ICA. Estimated  right ICA stenosis is less than 50%. 3. Mild plaque at the level of the proximal left ICA. Estimated left ICA stenosis is less than 50%. 4. Incidental 0.8 cm solid left and 1.6 cm cystic right thyroid nodules. Electronically Signed   By: Marcey Moan M.D.   On: 05/31/2024 15:20      ASSESSMENT/PLAN   68 year old pleasant white male seen today for several months of shortness of breath, patient was recently admitted for what seems to be COPD exacerbation with an abnormal chest x-ray with hyperinflated lungs and flattened diaphragms also with a diagnosis of hyponatremia and was discharged on Breo inhaler however is not working at this time  Assessment &  Plan Chronic obstructive pulmonary disease, unspecified COPD type (HCC) Assessment of shortness of breath Likely diagnosis of COPD Obtain pulmonary function testing to assess lung function Start Advair HFA 2 puffs in the morning 2 puffs at night Rinse mouth after use SOB (shortness of breath) Multifactorial in nature Likely COPD possible angina Deconditioned state Chest pressure Follow-up cardiology Hyponatremia Abnormal chest x-ray with hyponatremia and former smoker I will need to obtain CT of the chest to look for lung mass     MEDICATION ADJUSTMENTS/LABS AND TESTS ORDERED: Advair HFA Rinse mouth after every use Obtain CT of the chest Obtain pulm function testing Avoid Allergens and Irritants Avoid secondhand smoke Avoid SICK contacts Recommend  Masking  when appropriate Recommend Keep up-to-date with vaccinations  CURRENT MEDICATIONS REVIEWED AT LENGTH WITH PATIENT TODAY     Patient  satisfied with Plan of action and management. All questions answered   Follow up 3 months   I spent a total of 65 minutes dedicated to the care of this patient on the date of this encounter to include pre-visit review of records, face-to-face time with the patient discussing conditions above, post visit ordering of testing, clinical documentation with the electronic health record, making appropriate referrals as documented, and communicating necessary information to the patient's healthcare team.    The Patient requires high complexity decision making for assessment and support, frequent evaluation and titration of therapies, application of advanced monitoring technologies and extensive interpretation of multiple databases.  Patient satisfied with Plan of action and management. All questions answered    Nickolas Alm Cellar, M.D.  [facility]  Medical Director [facility] Medical Director [facility] Cardio-Pulmonary Department

## 2024-07-01 ENCOUNTER — Other Ambulatory Visit (HOSPITAL_COMMUNITY): Payer: Self-pay

## 2024-07-01 ENCOUNTER — Telehealth: Payer: Self-pay | Admitting: Pharmacy Technician

## 2024-07-01 NOTE — Telephone Encounter (Signed)
 Pharmacy Patient Advocate Encounter   Received notification from Patient Pharmacy that prior authorization for Advair HFA is required/requested.   Insurance verification completed.   The patient is insured through [facility] .   Per test claim: PA required; PA submitted to above mentioned insurance via Latent Key/confirmation #/EOC [id number] Status is pending   Plan prefers: Breo, Symbicort, Wixela, and Dulera

## 2024-07-05 ENCOUNTER — Other Ambulatory Visit: Payer: Self-pay | Admitting: Internal Medicine

## 2024-07-05 ENCOUNTER — Ambulatory Visit
Admission: RE | Admit: 2024-07-05 | Discharge: 2024-07-05 | Disposition: A | Discharge status: Home / Self Care | Source: Ambulatory Visit | Attending: Internal Medicine | Admitting: Internal Medicine

## 2024-07-05 ENCOUNTER — Other Ambulatory Visit: Payer: Self-pay

## 2024-07-05 ENCOUNTER — Other Ambulatory Visit: Payer: Self-pay | Admitting: Cardiovascular Disease

## 2024-07-05 DIAGNOSIS — E782 Mixed hyperlipidemia: Secondary | ICD-10-CM

## 2024-07-05 DIAGNOSIS — I6522 Occlusion and stenosis of left carotid artery: Secondary | ICD-10-CM

## 2024-07-05 DIAGNOSIS — I739 Peripheral vascular disease, unspecified: Secondary | ICD-10-CM

## 2024-07-05 DIAGNOSIS — R0602 Shortness of breath: Secondary | ICD-10-CM | POA: Diagnosis present

## 2024-07-05 DIAGNOSIS — J449 Chronic obstructive pulmonary disease, unspecified: Secondary | ICD-10-CM | POA: Diagnosis present

## 2024-07-05 MED ORDER — BUDESONIDE-FORMOTEROL FUMARATE 160-4.5 MCG/ACT IN AERO: 2.0000 | INHALATION_SPRAY | Freq: Two times a day (BID) | RESPIRATORY_TRACT | 12 refills | Status: AC

## 2024-07-05 NOTE — Telephone Encounter (Signed)
 Dr. Isaiah sent the prescription into [facility]. I need to know if the patient is okay with that or if he would like it sent to [facility].  LMTCB. E2C2 please advise when patient calls back.

## 2024-07-05 NOTE — Telephone Encounter (Signed)
 Pharmacy Patient Advocate Encounter  Received notification from [employer] that Prior Authorization for Advair HFA has been DENIED.  See denial reason below. No denial letter attached in CMM. Will attach denial letter to Media tab once received.  Requires trial of three formulary alternatives: Brand Symbicort , Dulera, and fluticasone -salmeterol/Wixela In[place].  PA #/Case ID/Reference #: [id number]

## 2024-07-05 NOTE — Progress Notes (Signed)
 Advair denied by insurance I was told Symbicort  was covered

## 2024-07-06 ENCOUNTER — Other Ambulatory Visit: Payer: Self-pay

## 2024-07-06 MED FILL — Evolocumab Subcutaneous Soln Auto-Injector 140 MG/ML: SUBCUTANEOUS | 84 days supply | Qty: 6 | Fill #0 | Status: AC

## 2024-07-06 NOTE — Telephone Encounter (Signed)
 I have notified the patient. Nothing further needed.

## 2024-07-07 ENCOUNTER — Encounter: Payer: Self-pay | Admitting: *Deleted

## 2024-07-10 NOTE — Progress Notes (Unsigned)
 Has he has brewedCardiology Office Note  Date:  07/11/2024   ID:  James [name], DOB Sep 12, 1956, MRN [id number]  PCP:  Donal Channing SQUIBB, FNP   Chief Complaint  Patient presents with   Follow-up    Patient was at [facility] on 05/26/2024 with Bronchospasm & hyponatremia. Patient c/o left leg swelling and pain in mid-sternum, palpitations, increase in heart rate since the D/C of the carvedilol  & has occasional shortness of breath. Patient denies any further symptoms of syncope or pre-syncope.     HPI:  LAMARCUS SPIRA a 68 y.o. male with past medical history of: left bundle branch block  hypertension.  cardiac catheterization in 2007 showed no obstructive disease.  hyperlipidemia  previous tobacco use.  COPD Anxiety disorder PAD, carotid: Moderate nonobstructive disease on left  exertional dyspnea, workup 2017 Who presents for recent follow-up after hospitalization for COPD exacerbation, lightheadedness, carotid stenosis, hyponatremia  Recent hospitalization May 26, 2024 shortness of breath, COPD exacerbation, hyponatremia 122 Treated with steroids, nebs Beta-blocker held for bradycardia and dizziness  Echocardiogram July 2025 showed low normal LV systolic function with an ejection fraction of 55%.   Carotid ultrasound July 2025 Significant noncalcified plaque in the mid left common carotid artery in the neck with elevated velocities. Stenosis may be 70% or greater based on velocities.  CT angio neck June 01, 2024 bulky low-density soft plaque in the distal Left CCA, with 50% stenosis.   Now with significant  left leg swelling 10 days  on amlodipine  since hospital discharge Reports he received Lasix from primary care  Denies significant shortness of breath  EKG personally reviewed by myself on todays visit EKG Interpretation Date/Time:  Monday July 11 2024 13:57:24 EDT Ventricular Rate:  90 PR Interval:  172 QRS Duration:  140 QT Interval:  386 QTC  Calculation: 472 R Axis:   11  Text Interpretation: Normal sinus rhythm Left bundle branch block When compared with ECG of 31-May-2024 12:36, T wave inversion no longer evident in Inferior leads QT has lengthened Confirmed by Perla Lye [phone]) on 07/11/2024 2:22:14 PM    PMH:   has a past medical history of Anxiety, Depression, Eyes sensitive to light, Family history of colonic polyps, Hand pain, right, History of palpitations, Hyperlipidemia, Hypertension (2007), Insomnia, Shoulder injury (1976), Special screening for malignant neoplasms, colon, and Spinal stenosis of lumbar region.  PSH:    Past Surgical History:  Procedure Laterality Date   CARDIAC CATHETERIZATION  2007   COLONOSCOPY  2013   KNEE SURGERY Left 1979   POLYPECTOMY  2013   TONSILLECTOMY  1964    Current Outpatient Medications  Medication Sig Dispense Refill   albuterol  (VENTOLIN  HFA) 108 (90 Base) MCG/ACT inhaler Inhale 2 puffs into the lungs every 6 (six) hours as needed for wheezing or shortness of breath. 18 g 0   ALPRAZolam  (XANAX ) 0.5 MG tablet Take 1 tablet (0.5 mg total) 3 (three) times daily as needed by mouth for anxiety. 90 tablet 2   amLODipine  (NORVASC ) 10 MG tablet Take 1 tablet (10 mg total) by mouth daily. 30 tablet 11   budesonide -formoterol  (SYMBICORT ) 160-4.5 MCG/ACT inhaler Inhale 2 puffs into the lungs 2 (two) times daily. 1 each 12   clopidogrel  (PLAVIX ) 75 MG tablet Take 1 tablet (75 mg total) by mouth daily. 30 tablet 6   Evolocumab  (REPATHA  SURECLICK) 140 MG/ML SOAJ Inject 140 mg into the skin every 14 (fourteen) days. 6 mL 1   fluticasone -salmeterol (ADVAIR HFA) 230-21 MCG/ACT inhaler Inhale  2 puffs into the lungs 2 (two) times daily. 1 each 12   furosemide (LASIX) 20 MG tablet Take 20 mg by mouth daily.     polyethylene glycol powder (GLYCOLAX /MIRALAX ) 17 GM/SCOOP powder Take 17 g by mouth daily. 238 g 0   No current facility-administered medications for this visit.    Allergies:    Naprosyn [naproxen]   Social History:  The patient  reports that he has quit smoking. His smoking use included cigarettes. He has a 25 pack-year smoking history. He has never used smokeless tobacco. He reports that he does not drink alcohol and does not use drugs.   Family History:   family history includes Arthritis in his mother; Cancer in his father; Colon cancer in his father; Heart failure in his mother; Hypertension in his mother; Kidney failure in his mother; Transient ischemic attack in his father.    Review of Systems: Review of Systems  Cardiovascular:  Positive for leg swelling.     PHYSICAL EXAM: VS:  BP 130/68 (BP Location: Left Arm, Patient Position: Sitting, Cuff Size: Normal)   Pulse 90   Ht 5' 7 (1.702 m)   Wt 207 lb 6 oz (94.1 kg)   SpO2 98%   BMI 32.48 kg/m  , BMI Body mass index is 32.48 kg/m. GEN: Well nourished, well developed, in no acute distress HEENT: normal Neck: no JVD, carotid bruits, or masses Cardiac: RRR; no murmurs, rubs, or gallops, 2+ swelling left lower extremity with mild erythema Respiratory:  clear to auscultation bilaterally, normal work of breathing GI: soft, nontender, nondistended, + BS MS: no deformity or atrophy Skin: warm and dry, no rash Neuro:  Strength and sensation are intact Psych: euthymic mood, full affect  Recent Labs: 05/27/2024: B Natriuretic Peptide 130.0; Hemoglobin 14.3; Platelets 204 05/31/2024: Magnesium 2.4 06/01/2024: BUN 19; Creatinine, Ser 1.07; Potassium 3.4; Sodium 134   Lipid Panel Lab Results  Component Value Date   CHOL 181 06/01/2024   HDL 51 06/01/2024   LDLCALC 91 06/01/2024   TRIG 195 (H) 06/01/2024    Wt Readings from Last 3 Encounters:  07/11/24 207 lb 6 oz (94.1 kg)  06/28/24 203 lb (92.1 kg)  06/14/24 199 lb 12.8 oz (90.6 kg)     ASSESSMENT AND PLAN:  Problem List Items Addressed This Visit       Cardiology Problems   Hypertension   Relevant Medications   furosemide (LASIX) 20 MG  tablet   Other Relevant Orders   EKG 12-Lead (Completed)   Hyperlipidemia   Relevant Medications   furosemide (LASIX) 20 MG tablet   Stenosis of left carotid artery   Relevant Medications   furosemide (LASIX) 20 MG tablet   Other Relevant Orders   EKG 12-Lead (Completed)   Pre-syncope   Relevant Medications   furosemide (LASIX) 20 MG tablet   PAD (peripheral artery disease) (HCC) - Primary   Relevant Medications   furosemide (LASIX) 20 MG tablet   Other Relevant Orders   EKG 12-Lead (Completed)   LBBB (left bundle branch block)   Relevant Medications   furosemide (LASIX) 20 MG tablet   Other Relevant Orders   EKG 12-Lead (Completed)     Other   Bronchospasm   Relevant Orders   EKG 12-Lead (Completed)   Generalized anxiety disorder   PAD Moderate carotid disease noted on imaging Recommend he stay on Repatha , Plavix   Essential hypertension Worsening leg swelling left greater than right Recommend he stop amlodipine  10 mg daily, Start losartan   100 mg daily instead Leg swelling should improve over the next month -Will likely not require Lasix after holding amlodipine   Hyperlipidemia Continue Repatha , goal LDL less than 55  Hyponatremia Improved in the hospital, recommend he try to avoid diuretics if possible which could lead to low sodium  Dizziness Symptoms have improved, most likely linked to hyponatremia    Signed, Velinda Lunger, M.D., Ph.D. [facility] [place], [place] [phone]

## 2024-07-11 ENCOUNTER — Other Ambulatory Visit: Payer: Self-pay

## 2024-07-11 ENCOUNTER — Ambulatory Visit: Attending: Cardiovascular Disease | Admitting: Cardiovascular Disease

## 2024-07-11 ENCOUNTER — Encounter: Payer: Self-pay | Admitting: Cardiovascular Disease

## 2024-07-11 VITALS — BP 130/68 | HR 90 | Ht 67.0 in | Wt 207.4 lb

## 2024-07-11 DIAGNOSIS — R55 Syncope and collapse: Secondary | ICD-10-CM | POA: Diagnosis not present

## 2024-07-11 DIAGNOSIS — I6522 Occlusion and stenosis of left carotid artery: Secondary | ICD-10-CM

## 2024-07-11 DIAGNOSIS — E782 Mixed hyperlipidemia: Secondary | ICD-10-CM

## 2024-07-11 DIAGNOSIS — I447 Left bundle-branch block, unspecified: Secondary | ICD-10-CM | POA: Diagnosis not present

## 2024-07-11 DIAGNOSIS — I739 Peripheral vascular disease, unspecified: Secondary | ICD-10-CM

## 2024-07-11 DIAGNOSIS — I10 Essential (primary) hypertension: Secondary | ICD-10-CM

## 2024-07-11 DIAGNOSIS — Z789 Other specified health status: Secondary | ICD-10-CM

## 2024-07-11 DIAGNOSIS — F411 Generalized anxiety disorder: Secondary | ICD-10-CM

## 2024-07-11 DIAGNOSIS — J9801 Acute bronchospasm: Secondary | ICD-10-CM

## 2024-07-11 MED ORDER — REPATHA SURECLICK 140 MG/ML ~~LOC~~ SOAJ: 1.0000 mL | SUBCUTANEOUS | 4 refills | Status: DC

## 2024-07-11 MED ORDER — LOSARTAN POTASSIUM 100 MG PO TABS: 100.0000 mg | ORAL_TABLET | Freq: Every day | ORAL | 3 refills | Status: DC

## 2024-07-11 NOTE — Patient Instructions (Addendum)
 Medication Instructions:   Stop amlodipine   Please start losartan  100 mg daily  Please send in blood pressure readings  If you need a refill on your cardiac medications before your next appointment, please call your pharmacy.   Lab work: No new labs needed  Testing/Procedures: No new testing needed  Follow-Up: At [facility], you and your health needs are our priority.  As part of our continuing mission to provide you with exceptional heart care, we have created designated Provider Care Teams.  These Care Teams include your primary Cardiologist (physician) and Advanced Practice Providers (APPs -  Physician Assistants and Nurse Practitioners) who all work together to provide you with the care you need, when you need it.  You will need a follow up appointment in 12 months  Providers on your designated Care Team:   Lonni Meager, NP Bernardino Bring, PA-C Cadence [place], [place]  COVID-19 Vaccine Information can be found at: [email or web address] For questions related to vaccine distribution or appointments, please email [email or web address] or call 5057442461.

## 2024-07-13 ENCOUNTER — Other Ambulatory Visit: Payer: Self-pay

## 2024-07-14 ENCOUNTER — Telehealth: Payer: Self-pay | Admitting: Internal Medicine

## 2024-07-14 NOTE — Telephone Encounter (Signed)
 LVMTCB to schedule appointment on 07/19/2024 with Dr. Isaiah at 2:30 pm to go over results.

## 2024-07-21 ENCOUNTER — Encounter: Payer: Self-pay | Admitting: Internal Medicine

## 2024-07-21 ENCOUNTER — Ambulatory Visit
Admission: RE | Admit: 2024-07-21 | Discharge: 2024-07-21 | Disposition: A | Discharge status: Home / Self Care | Source: Ambulatory Visit | Attending: Internal Medicine | Admitting: Internal Medicine

## 2024-07-21 ENCOUNTER — Ambulatory Visit (INDEPENDENT_AMBULATORY_CARE_PROVIDER_SITE_OTHER): Admitting: Internal Medicine

## 2024-07-21 VITALS — BP 130/80 | HR 78 | Temp 98.6°F | Ht 67.0 in | Wt 210.8 lb

## 2024-07-21 DIAGNOSIS — R609 Edema, unspecified: Secondary | ICD-10-CM

## 2024-07-21 DIAGNOSIS — J449 Chronic obstructive pulmonary disease, unspecified: Secondary | ICD-10-CM | POA: Diagnosis not present

## 2024-07-21 DIAGNOSIS — Z87891 Personal history of nicotine dependence: Secondary | ICD-10-CM

## 2024-07-21 DIAGNOSIS — M7989 Other specified soft tissue disorders: Secondary | ICD-10-CM

## 2024-07-21 DIAGNOSIS — R918 Other nonspecific abnormal finding of lung field: Secondary | ICD-10-CM | POA: Diagnosis not present

## 2024-07-21 NOTE — Patient Instructions (Signed)
 Recommend obtaining ultrasound of his lower extremity to assess for  blood clots CT of your chest demonstrates a mass which will need lung biopsy

## 2024-07-21 NOTE — Progress Notes (Unsigned)
 [facility] [place] Pulmonary Medicine Consultation      Date: 07/21/2024,   MRN# [id number] James [name] [birth year]   CHIEF COMPLAINT:   Assessment of COPD Assessment of abnormal CT chest Left leg swelling  HISTORY OF PRESENT ILLNESS  Patient with a recent COPD exacerbation in the emergency room with hyponatremia CT of the chest obtained  Very concerning for left lower lobe mass Strong suggestion for malignancy  Patient also has left leg swelling which is very concerning for blood clot and DVT Patient will need ultrasound assumes possible  Patient currently on Plavix  for carotid artery stenosis Will discuss with vascular surgery about alternative therapy    Chest x-ray July 2025 reviewed  Findings suggest hyperinflated lungs with flattened diaphragms Probable underlying obstructive lung disease   CT chest September 2025 reviewed in detail with patient today Strong concern for left lung mass consistent with malignancy      Assessment of Dizziness Carotid Doppler showed significant noncalcified plaque in the mid left common carotid artery in the neck with elevated velocities. Stenosis may be 70% or less.  Greater based on velocities.   Correlation with CT angiography of the neck may be helpful. Mild plaque in the distal right carotid bulb and proximal right ICA. Estimated right ICA stenosis is less than 50%.  MRI of the brain was negative for an acute stroke CT angiogram showed low-density soft plaque in the distal Left CCA, with 50% stenosis. No hemodynamically significant arterial stenosis.  Follow-up with vascular surgery as an outpatient PAST MEDICAL HISTORY   Past Medical History:  Diagnosis Date   Anxiety    history of   Depression    history of    Eyes sensitive to light    due to side effect of manufactured version of Gabapentin    Family history of colonic polyps    Hand pain, right    History of palpitations    intermittent; patient states heart skips a  beat at times   Hyperlipidemia    Hypertension 2007   Insomnia    Shoulder injury 1976   Special screening for malignant neoplasms, colon    Spinal stenosis of lumbar region      SURGICAL HISTORY   Past Surgical History:  Procedure Laterality Date   CARDIAC CATHETERIZATION  2007   COLONOSCOPY  2013   KNEE SURGERY Left 1979   POLYPECTOMY  2013   TONSILLECTOMY  1964     FAMILY HISTORY   Family History  Problem Relation Age of Onset   Arthritis Mother    Heart failure Mother    Hypertension Mother    Kidney failure Mother    Colon cancer Father    Cancer Father        colon   Transient ischemic attack Father        history of     SOCIAL HISTORY   Social History   Tobacco Use   Smoking status: Former    Current packs/day: 1.00    Average packs/day: 1 pack/day for 25.0 years (25.0 ttl pk-yrs)    Types: Cigarettes   Smokeless tobacco: Never  Vaping Use   Vaping status: Never Used  Substance Use Topics   Alcohol use: No    Alcohol/week: 0.0 standard drinks of alcohol   Drug use: No     MEDICATIONS    Home Medication:  Current Outpatient Rx   Order #: [id number] Class: Normal   Order #: [id number] Class: Print   Order #: [id number] Class: Normal  Order #: [id number] Class: Normal   Order #: [id number] Class: Normal   Order #: [id number] Class: Normal   Order #: [id number] Class: Historical Med   Order #: [id number] Class: Normal   Order #: [id number] Class: Normal    Current Medication:  Current Outpatient Medications:    albuterol  (VENTOLIN  HFA) 108 (90 Base) MCG/ACT inhaler, Inhale 2 puffs into the lungs every 6 (six) hours as needed for wheezing or shortness of breath., Disp: 18 g, Rfl: 0   ALPRAZolam  (XANAX ) 0.5 MG tablet, Take 1 tablet (0.5 mg total) 3 (three) times daily as needed by mouth for anxiety., Disp: 90 tablet, Rfl: 2   budesonide -formoterol  (SYMBICORT ) 160-4.5 MCG/ACT inhaler, Inhale 2 puffs into the lungs 2 (two) times daily., Disp: 1 each, Rfl:  12   clopidogrel  (PLAVIX ) 75 MG tablet, Take 1 tablet (75 mg total) by mouth daily., Disp: 30 tablet, Rfl: 6   Evolocumab  (REPATHA  SURECLICK) 140 MG/ML SOAJ, Inject 140 mg into the skin every 14 (fourteen) days., Disp: 6 mL, Rfl: 4   fluticasone -salmeterol (ADVAIR HFA) 230-21 MCG/ACT inhaler, Inhale 2 puffs into the lungs 2 (two) times daily., Disp: 1 each, Rfl: 12   furosemide (LASIX) 20 MG tablet, Take 20 mg by mouth daily., Disp: , Rfl:    losartan  (COZAAR ) 100 MG tablet, Take 1 tablet (100 mg total) by mouth daily., Disp: 90 tablet, Rfl: 3   polyethylene glycol powder (GLYCOLAX /MIRALAX ) 17 GM/SCOOP powder, Take 17 g by mouth daily., Disp: 238 g, Rfl: 0    ALLERGIES   Naprosyn [naproxen]  BP 130/80   Pulse 78   Temp 98.6 F (37 C)   Ht 5' 7 (1.702 m)   Wt 210 lb 12.8 oz (95.6 kg)   SpO2 98%   BMI 33.02 kg/m        Review of Systems: Gen:  Denies  fever, sweats, chills weight loss  HEENT: Denies blurred vision, double vision, ear pain, eye pain, hearing loss, nose bleeds, sore throat Cardiac:  No dizziness, chest pain or heaviness, chest tightness,edema, No JVD Resp:   No cough, -sputum production, -shortness of breath,-wheezing, -hemoptysis,  Leg swelling Other:  All other systems negative   Physical Examination:   General Appearance: No distress  EYES PERRLA, EOM intact.   NECK Supple, No JVD Pulmonary: normal breath sounds, No wheezing.  CardiovascularNormal S1,S2.  No m/r/g.   Abdomen: Benign, Soft, non-tender. Neurology UE/LE 5/5 strength, no focal deficits Ext pulses intact, cap refill intact Left leg edema ALL OTHER ROS ARE NEGATIVE      IMAGING    CT CHEST WO CONTRAST Result Date: 07/14/2024 CLINICAL DATA:  COPD suspected. EXAM: CT CHEST WITHOUT CONTRAST TECHNIQUE: Multidetector CT imaging of the chest was performed following the standard protocol without IV contrast. RADIATION DOSE REDUCTION: This exam was performed according to the departmental  dose-optimization program which includes automated exposure control, adjustment of the mA and/or kV according to patient size and/or use of iterative reconstruction technique. COMPARISON:  Chest radiograph dated 10/09/2015. FINDINGS: Evaluation of this exam is limited in the absence of intravenous contrast. Cardiovascular: There is no cardiomegaly or pericardial effusion. Three-vessel coronary vascular calcification. Mild atherosclerotic calcification of the thoracic aorta. No aneurysmal dilatation. The central pulmonary arteries are grossly unremarkable. Mediastinum/Nodes: No hilar or mediastinal adenopathy. The esophagus and the thyroid gland are grossly unremarkable. No mediastinal fluid collection. Lungs/Pleura: Background of mild centrilobular emphysema. A 3.9 x 2.5 cm lobulated mass in the left lower lobe most concerning for a bronchogenic carcinoma. Clinical correlation and  multidisciplinary consult is advised. There is associated occlusion of the anterior basal bronchus. Scattered micro nodularity in the anterior aspect of the left lower lobe likely postobstructive pneumonia. Aspiration is not excluded. There is no pleural effusion pneumothorax. The central airways are patent. Upper Abdomen: Multiple gallstones. Musculoskeletal: Osteopenia with degenerative changes of the spine. No acute osseous pathology. IMPRESSION: 1. Lobulated left lower lobe mass most concerning for a bronchogenic carcinoma. Multidisciplinary consult is advised. 2. Scattered micro nodularity in the anterior aspect of the left lower lobe likely postobstructive pneumonia. Aspiration is not excluded. 3. Cholelithiasis. 4. Aortic Atherosclerosis (ICD10-I70.0) and Emphysema (ICD10-J43.9). Electronically Signed   By: Vanetta Chou M.D.   On: 07/14/2024 15:45      ASSESSMENT/PLAN   68 year old pleasant white male seen today for several months of shortness of breath, patient was recently admitted for what seems to be COPD exacerbation  with an abnormal chest x-ray with hyperinflated lungs and flattened diaphragms also with a diagnosis of hyponatremia.  CT chest reveals left lung mass patient also with signs symptoms of lower extremity swelling I am very concerned about DVT  Assessment & Plan Chronic obstructive pulmonary disease, unspecified COPD type (HCC) Stable at this time uses Advair and albuterol  No exacerbation at this time No evidence of heart failure at this time No evidence or signs of infection at this time No respiratory distress No fevers, chills, nausea, vomiting, diarrhea No evidence of lower extremity edema No evidence hemoptysis  Lung mass CT chest with left lung mass highly suggestive of lung cancer Recommend bronchoscopy for definitive diagnosis however at this time patient is currently on Plavix  but is also concerned about left lower leg DVT as he has swelling patient will possibly need the use of oral anticoagulation Leg swelling Patient will need ultrasound left leg stat to assess for DVT Will contact vascular surgery for further assessment with anticoagulation     MEDICATION ADJUSTMENTS/LABS AND TESTS ORDERED: Advair HFA Rinse mouth after every use Obtain ultrasound lower extremity assess for DVT Avoid Allergens and Irritants Avoid secondhand smoke Avoid SICK contacts Recommend  Masking  when appropriate Recommend Keep up-to-date with vaccinations   CURRENT MEDICATIONS REVIEWED AT LENGTH WITH PATIENT TODAY     Patient  satisfied with Plan of action and management. All questions answered   Follow up 4 weeks   I spent a total of 65 minutes dedicated to the care of this patient on the date of this encounter to include pre-visit review of records, face-to-face time with the patient discussing conditions above, post visit ordering of testing, clinical documentation with the electronic health record, making appropriate referrals as documented, and communicating necessary information to  the patient's healthcare team.    The Patient requires high complexity decision making for assessment and support, frequent evaluation and titration of therapies, application of advanced monitoring technologies and extensive interpretation of multiple databases.  Patient satisfied with Plan of action and management. All questions answered    Nickolas Alm Cellar, M.D.  [facility]  Medical Director [facility] Medical Director [facility] Cardio-Pulmonary Department

## 2024-07-22 ENCOUNTER — Telehealth: Payer: Self-pay | Admitting: Internal Medicine

## 2024-07-22 ENCOUNTER — Other Ambulatory Visit: Payer: Self-pay | Admitting: Internal Medicine

## 2024-07-22 DIAGNOSIS — R918 Other nonspecific abnormal finding of lung field: Secondary | ICD-10-CM

## 2024-07-22 NOTE — Addendum Note (Signed)
 Addended by: Charlee Squibb on: 07/22/2024 09:23 AM   Modules accepted: Orders

## 2024-07-25 NOTE — Telephone Encounter (Signed)
 For the codes 63872, [phone], [phone] Prior [facility] Not Required Refer Odella HERO. [id number]

## 2024-07-25 NOTE — Telephone Encounter (Signed)
 I have notified the patient. He wanted his procedure done earlier then 2:00pm due to having to be NPO after midnight. I have moved his procedure to 08/08/2024 at 8:00am  James [name] can you please reschedule his CT and let me know and I will call him back with all the information.

## 2024-07-25 NOTE — Telephone Encounter (Signed)
 Per Donzell- Rescheduled to [facility] on 08/05/24 @ 1:00pm

## 2024-07-25 NOTE — Telephone Encounter (Addendum)
 Robotic Bronchoscopy with EBUS 08/01/2024 at 2:00pm Lung nodule 31627, 31652, [id number]  Donzell please see Bronch info.   Bronch email has been sent.  CT- 08/01/24 @ 1:00pm at [facility]

## 2024-07-26 NOTE — Telephone Encounter (Signed)
 Robotic Bronch EBUS 08/08/2024 at 8:00am CT 10/3 at 1:00am.  I have notified the patient.   Nothing further needed.

## 2024-07-27 ENCOUNTER — Encounter: Payer: Self-pay | Admitting: Urgent Care

## 2024-07-27 ENCOUNTER — Encounter
Admission: RE | Admit: 2024-07-27 | Discharge: 2024-07-27 | Disposition: A | Discharge status: Home / Self Care | Source: Ambulatory Visit | Attending: Pulmonary Disease | Admitting: Pulmonary Disease

## 2024-07-27 ENCOUNTER — Telehealth: Payer: Self-pay

## 2024-07-27 ENCOUNTER — Other Ambulatory Visit: Payer: Self-pay

## 2024-07-27 HISTORY — DX: Long term (current) use of anticoagulants: Z79.01

## 2024-07-27 HISTORY — DX: Left bundle-branch block, unspecified: I44.7

## 2024-07-27 HISTORY — DX: Occlusion and stenosis of left carotid artery: I65.22

## 2024-07-27 HISTORY — DX: Personal history of nicotine dependence: Z87.891

## 2024-07-27 HISTORY — DX: Other nonspecific abnormal finding of lung field: R91.8

## 2024-07-27 HISTORY — DX: Chronic obstructive pulmonary disease, unspecified: J44.9

## 2024-07-27 HISTORY — DX: Other specified symptoms and signs involving the circulatory and respiratory systems: R09.89

## 2024-07-27 HISTORY — DX: Peripheral vascular disease, unspecified: I73.9

## 2024-07-27 NOTE — Telephone Encounter (Signed)
 Dr. Gollan,  You saw this patient on 07/11/2024. Per protocol we request that you comment on his cardiac risk to proceed with  VIDEO BRONCHOSCOPY WITH ENDOBRONCHIAL NAVIGATION (Left); ENDOBRONCHIAL ULTRASOUND (EBUS) (Left) and holding Plavix ,  since it has been less than 2 months since evaluated in the office. Please send your comment to P CV Pre-Op Pool.  Thank you, Lamarr Satterfield DNP, ANP, AACC.

## 2024-07-27 NOTE — Patient Instructions (Addendum)
 Your procedure is scheduled on:08-08-24 Monday Report to the Registration Desk on the 1st floor of the [facility].Then proceed to the 2nd floor Surgery Desk To find out your arrival time, please call [phone] between 1PM - 3PM on:08-05-24 Friday If your arrival time is 6:00 am, do not arrive before that time as the [facility] entrance doors do not open until 6:00 am.  REMEMBER: Instructions that are not followed completely may result in serious medical risk, up to and including death; or upon the discretion of your surgeon and anesthesiologist your surgery may need to be rescheduled.  Do not eat food OR drink liquids after midnight the night before surgery.  No gum chewing or hard candies.  One week prior to surgery:Last dose will be on 07-31-24  Stop Anti-inflammatories (NSAIDS) such as Advil , Aleve, Ibuprofen , Motrin , Naproxen, Naprosyn and Aspirin (325 mg) based products such as Excedrin, Goody's Powder, BC Powder. Stop ANY OVER THE COUNTER supplements until after surgery.  You may however, continue to take Tylenol  if needed for pain up until the day of surgery.  Stop clopidogrel  (PLAVIX ) 5 days prior to surgery-Last dose will be on 08-02-24 Tuesday  Continue taking all of your other prescription medications up until the day of surgery.  ON THE DAY OF SURGERY ONLY TAKE THESE MEDICATIONS WITH SIPS OF WATER: -amLODipine  (NORVASC )  -You may use your ALPRAZolam  (XANAX ) if needed for anxiety  Use your Symbicort  Inhaler at home the day of surgery and bring your Albuterol  Inhaler to the hospital  No Alcohol for 24 hours before or after surgery.  No Smoking including e-cigarettes for 24 hours before surgery.  No chewable tobacco products for at least 6 hours before surgery.  No nicotine patches on the day of surgery.  Do not use any recreational drugs for at least a week (preferably 2 weeks) before your surgery.  Please be advised that the combination of cocaine and anesthesia  may have negative outcomes, up to and including death. If you test positive for cocaine, your surgery will be cancelled.  On the morning of surgery brush your teeth with toothpaste and water, you may rinse your mouth with mouthwash if you wish. Do not swallow any toothpaste or mouthwash.  Do not wear jewelry, make-up, hairpins, clips or nail polish.  For welded (permanent) jewelry: bracelets, anklets, waist bands, etc.  Please have this removed prior to surgery.  If it is not removed, there is a chance that hospital personnel will need to cut it off on the day of surgery.  Do not wear lotions, powders, or perfumes.   Do not shave body hair from the neck down 48 hours before surgery.  Contact lenses, hearing aids and dentures may not be worn into surgery.  Do not bring valuables to the hospital. [facility] is not responsible for any missing/lost belongings or valuables.   Notify your doctor if there is any change in your medical condition (cold, fever, infection).  Wear comfortable clothing (specific to your surgery type) to the hospital.  After surgery, you can help prevent lung complications by doing breathing exercises.  Take deep breaths and cough every 1-2 hours. Your doctor may order a device called an Incentive Spirometer to help you take deep breaths. When coughing or sneezing, hold a pillow firmly against your incision with both hands. This is called "splinting." Doing this helps protect your incision. It also decreases belly discomfort.  If you are being admitted to the hospital overnight, leave your suitcase  in the car. After surgery it may be brought to your room.  In case of increased patient census, it may be necessary for you, the patient, to continue your postoperative care in the Same Day Surgery department.  If you are being discharged the day of surgery, you will not be allowed to drive home. You will need a responsible individual to drive you home and stay with you  for 24 hours after surgery.   If you are taking public transportation, you will need to have a responsible individual with you.  Please call the Pre-admissions Testing Dept. at [phone] if you have any questions about these instructions.  Surgery Visitation Policy:  Patients having surgery or a procedure may have two visitors.  Children under the age of 38 must have an adult with them who is not the patient.   Merchandiser, retail to address health-related social needs:  [email or web address]

## 2024-07-27 NOTE — Telephone Encounter (Signed)
   Pre-operative Risk Assessment    Patient Name: James [name]  DOB: [birth year] MRN: [id number]   Date of last office visit: 07/11/24 EVALENE LUNGER, MD Date of next office visit: NONE   Request for Surgical Clearance    Procedure:  VIDEO BRONCHOSCOPY WITH ENDOBRONCHIAL NAVIGATION (Left); ENDOBRONCHIAL ULTRASOUND (EBUS) (Left)   Date of Surgery:  Clearance 08/08/24                                Surgeon:  Dr. Dedra Jourdain  Surgeon's Group or Practice Name:  [facility]  Phone number:  [phone]  Fax number:  [phone] (In efforts to conserve resources (paper), return FAX not required. I will follow up in [facility]).    Type of Clearance Requested:   - Medical  - Pharmacy:  Hold Aspirin and Clopidogrel  (Plavix )     Type of Anesthesia:  General    Additional requests/questions:    Signed, Lucie DELENA Ku   07/27/2024, 11:40 AM       Elnor Dorise BRAVO, NP  P Cv Div Preop Callback Request for pre-operative cardiac clearance:   1. What type of surgery is being performed? VIDEO BRONCHOSCOPY WITH ENDOBRONCHIAL NAVIGATION (Left); ENDOBRONCHIAL ULTRASOUND (EBUS) (Left)  2. When is this surgery scheduled? 08/08/2024   3. Type of clearance being requested (medical, pharmacy, both)? MEDICAL   4. Are there any medications that need to be held prior to surgery? CLOPIDOGREL   5. Practice name and name of physician performing surgery? Performing surgeon: Dr. Dedra Jourdain, MD Requesting clearance: Dorise Elnor, FNP-C     6. Anesthesia type (none, local, MAC, general)? GENERAL  7. What is the office phone and fax number?   Phone: [phone] Fax: [phone] (In efforts to conserve resources (paper), return FAX not required. I will follow up in [facility]).  ATTENTION: Unable to create telephone message as per your standard workflow. Directed by [facility] providers to send requests for cardiac clearance to this pool for appropriate distribution to  provider covering pre-operative clearances.  Dorise Elnor, MSN, APRN, FNP-C, CEN [facility] Peri-operative Services Nurse Practitioner Phone: [phone] 07/27/24 11:10 AM

## 2024-07-27 NOTE — Telephone Encounter (Signed)
-----   Message from James [name] sent at 07/27/2024 11:10 AM EDT ----- Regarding: Request for pre-operative cardiac clearance Request for pre-operative cardiac clearance:  1. What type of surgery is being performed?  VIDEO BRONCHOSCOPY WITH ENDOBRONCHIAL NAVIGATION (Left); ENDOBRONCHIAL ULTRASOUND (EBUS) (Left)   2. When is this surgery scheduled?  08/08/2024  3. Type of clearance being requested (medical, pharmacy, both)? MEDICAL   4. Are there any medications that need to be held prior to surgery? CLOPIDOGREL   5. Practice name and name of physician performing surgery?  Performing surgeon: Dr. Dedra Jourdain, MD Requesting clearance: James Pereyra, FNP-C    6. Anesthesia type (none, local, MAC, general)? GENERAL  7. What is the office phone and fax number?   Phone: [phone] Fax: [phone] (In efforts to conserve resources (paper), return FAX not required. I will follow up in [facility]).  ATTENTION: Unable to create telephone message as per your standard workflow. Directed by [facility] providers to send requests for cardiac clearance to this pool for appropriate distribution to provider covering pre-operative clearances.   James Pereyra, MSN, APRN, FNP-C, CEN [facility]  Peri-operative Services Nurse Practitioner Phone: [phone] 07/27/24 11:10 AM

## 2024-07-28 NOTE — Telephone Encounter (Signed)
 Copied from CRM #[id number]. Topic: Clinical - Medical Advice >> Jul 27, 2024 12:40 PM Rilla B wrote: Reason for CRM: Patient is scheduled for a procedure on Monday 10/06 and states he must have someone with him and they must stay with him 24 hours.  Patient has no one to stay with him 24 hours. Per pre admit, another option is to stay in the hospital overnight IF insurance will cover it.  Please call patient @ [phone].

## 2024-07-28 NOTE — Telephone Encounter (Signed)
 Per Dr.Kasa- he can only stay in the hospital if there is a complication from the procedure. And Dr. Isaiah does not expect that to happen.    LM x1 for the patient.

## 2024-07-29 ENCOUNTER — Ambulatory Visit

## 2024-07-29 NOTE — Telephone Encounter (Addendum)
 I spoke with the patient. He will not have someone to stay with his at his house after the procedure. Is this something you require? If so, what else can be done?

## 2024-07-29 NOTE — Telephone Encounter (Signed)
   Patient Name: James [name]  DOB: 04/07/1956 MRN: [id number]  Primary Cardiologist: Deatrice Cage, MD  Chart reviewed as part of pre-operative protocol coverage. Given past medical history and time since last visit, based on ACC/AHA guidelines, James [name] is at acceptable risk for the planned procedure without further cardiovascular testing.   Per Dr.Gollan 07/29/2024 Acceptable risk for bronchoscopy No further cardiac testing needed Does not appear there are medications to hold Thx Velinda Lunger  Per office protocol, if patient is without any new symptoms or concerns at the time of their virtual visit, he may hold Plavix  for 5 days and Aspirin for 7 days prior to procedure. Please resume Plavix  and Aspirin as soon as possible postprocedure, at the discretion of the surgeon.    The patient was advised that if he develops new symptoms prior to surgery to contact our office to arrange for a follow-up visit, and he verbalized understanding.  I will route this recommendation to the requesting party via Epic fax function and remove from pre-op pool.  Please call with questions.  Lamarr Satterfield, NP 07/29/2024, 1:38 PM

## 2024-08-01 ENCOUNTER — Ambulatory Visit

## 2024-08-02 NOTE — Telephone Encounter (Addendum)
 Per secure chat with Dorise Pereyra, NP-  Someone has to be with them 24 hours after anesthesia. CJ can take them home, but there has to be someone 18+ there to be with him.   I have notified the patient. He said that is not sao [place] and [place] happen and that he does not have anyone that can stay with him.   I told him I would get the procedure canceled and call back with Dr. Jacqulyn suggestion going forward.  Dr. Isaiah- okay to cancel the procedure and please advise on what the plan is going forward.

## 2024-08-05 ENCOUNTER — Ambulatory Visit: Admission: RE | Admit: 2024-08-05 | Source: Ambulatory Visit

## 2024-08-05 NOTE — Telephone Encounter (Signed)
 I have notified the patient and canceled all test and the procedure.   Nothing further needed.

## 2024-08-08 ENCOUNTER — Encounter: Admission: RE | Payer: Self-pay | Source: Home / Self Care

## 2024-08-08 ENCOUNTER — Ambulatory Visit: Admission: RE | Admit: 2024-08-08 | Source: Home / Self Care | Admitting: Pulmonary Disease

## 2024-08-08 DIAGNOSIS — R918 Other nonspecific abnormal finding of lung field: Secondary | ICD-10-CM

## 2024-08-08 SURGERY — VIDEO BRONCHOSCOPY WITH ENDOBRONCHIAL NAVIGATION
Anesthesia: General | Laterality: Left

## 2024-08-24 ENCOUNTER — Encounter: Payer: Self-pay | Admitting: Nurse Practitioner

## 2024-08-24 ENCOUNTER — Ambulatory Visit

## 2024-08-24 ENCOUNTER — Ambulatory Visit: Admitting: Internal Medicine

## 2024-08-24 ENCOUNTER — Ambulatory Visit (INDEPENDENT_AMBULATORY_CARE_PROVIDER_SITE_OTHER): Admitting: Nurse Practitioner

## 2024-08-24 VITALS — BP 124/78 | HR 93 | Ht 67.0 in | Wt 212.2 lb

## 2024-08-24 DIAGNOSIS — J449 Chronic obstructive pulmonary disease, unspecified: Secondary | ICD-10-CM

## 2024-08-24 DIAGNOSIS — R918 Other nonspecific abnormal finding of lung field: Secondary | ICD-10-CM | POA: Diagnosis not present

## 2024-08-24 DIAGNOSIS — R0602 Shortness of breath: Secondary | ICD-10-CM

## 2024-08-24 LAB — PULMONARY FUNCTION TEST
DL/VA % pred: 83 %
DL/VA: 3.41 ml/min/mmHg/L
DLCO unc % pred: 83 %
DLCO unc: 19.69 ml/min/mmHg
FEF 25-75 Post: 1.5 L/s
FEF 25-75 Pre: 1.58 L/s
FEF2575-%Change-Post: -4 %
FEF2575-%Pred-Post: 66 %
FEF2575-%Pred-Pre: 70 %
FEV1-%Change-Post: 0 %
FEV1-%Pred-Post: 87 %
FEV1-%Pred-Pre: 88 %
FEV1-Post: 2.55 L
FEV1-Pre: 2.57 L
FEV1FVC-%Change-Post: -1 %
FEV1FVC-%Pred-Pre: 94 %
FEV6-%Change-Post: 0 %
FEV6-%Pred-Post: 98 %
FEV6-%Pred-Pre: 98 %
FEV6-Post: 3.69 L
FEV6-Pre: 3.66 L
FEV6FVC-%Change-Post: 0 %
FEV6FVC-%Pred-Post: 105 %
FEV6FVC-%Pred-Pre: 105 %
FVC-%Change-Post: 0 %
FVC-%Pred-Post: 93 %
FVC-%Pred-Pre: 92 %
FVC-Post: 3.72 L
FVC-Pre: 3.69 L
Post FEV1/FVC ratio: 68 %
Post FEV6/FVC ratio: 99 %
Pre FEV1/FVC ratio: 70 %
Pre FEV6/FVC Ratio: 99 %
RV % pred: 110 %
RV: 2.5 L
TLC % pred: 102 %
TLC: 6.59 L

## 2024-08-24 NOTE — Assessment & Plan Note (Signed)
 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 Last exacerbation July 2025. Trigger prevention reviewed. Action plan in place. Encouraged to work on graded exercises.   Patient Instructions  Continue Albuterol  inhaler 2 puffs every 6 hours as needed for shortness of breath or wheezing. Notify if symptoms persist despite rescue inhaler/neb use. Continue Symbicort  2 puffs Twice daily. Brush tongue and rinse mouth afterwards  PET scan ordered for further evaluation of the lung mass If the PET scan is concerning for a lung cancer, we will reschedule your biopsy and help arrange transportation. You will just need to find someone to stay with you for a brief period afterwards  Follow up in 6 weeks with James [name] or James [name]. We may adjust this pending PET scan results. If symptoms do not improve or worsen, please contact office for sooner follow up or seek emergency care.

## 2024-08-24 NOTE — Progress Notes (Signed)
 @Patient  ID: James [name] Alert, male    DOB: 1956-08-01, 68 y.o.   MRN: [id number]  Chief Complaint  Patient presents with   COPD    DOE. No wheezing. Occasional cough. PFT today. Symbicort  BID, helps with his breathing. Albuterol  PRN.    Referring provider: Kasa, Kurian, MD  HPI: 68 year old male, former smoker followed for lung mass and COPD. He is a patient of Dr. Jacqulyn and last seen in office 07/21/2024. Past medical history significant for PAD, LBBB, HTN, insomnia, HLD, anxiety.   TEST/EVENTS:  Reviewed today, 08/24/2024 07/05/2024 CT chest: atherosclerosis. Emphysema. 3.9x2.5 cm lobulated mass in LLL, concerning for bronchogenic carcinoma. Associated occlusion of basal bronchus. Scattered micronodularity throughout LLL, likely postobstructive pna.  08/24/2024 PFT: FVC 92, FEV1 88, ratio 68, TLC 102, DLCO 83. No BD. Mild COPD  07/21/2024: OV with Dr. Isaiah. Recent AECOPD. CT chest was obtained in the ED, concerning for lung mass in LLL. Has LLE swelling, concerning for DVT. COPD is stable. Highly suggestive of lung cancer on CT chest. Plan for bronchoscopy for definitive diagnosis. Pt will need US  LLE.   08/24/2024: Today - follow up Discussed the use of AI scribe software for clinical note transcription with the patient, who gave verbal consent to proceed.  History of Present Illness Patient presents today for follow-up.   He was scheduled for a bronchoscopy to evaluate a lung mass, but the procedure was not completed due to transportation issues post-procedure. This has not been rescheduled.   The doppler exam he had was negative for DVT. Swelling has improved.   He uses Symbicort  twice daily and albuterol  infrequently, only a couple of times a month. No significant cough, hemoptysis, or unexplained weight loss, or anorexia. No wheezing, chest congestion. No chest pain. Breathing feels stable.     Allergies  Allergen Reactions   Naprosyn [Naproxen] Itching    Immunization  History  Administered Date(s) Administered   Influenza,inj,Quad PF,6+ Mos 11/13/2015, 11/25/2016   Influenza-Unspecified 08/10/2017, 07/04/2024   Tdap 05/27/2016    Past Medical History:  Diagnosis Date   Abdominal bruit    Anxiety    Chronic anticoagulation    COPD (chronic obstructive pulmonary disease) (HCC)    Depression    Eyes sensitive to light    due to side effect of manufactured version of Gabapentin    Family history of colonic polyps    Former smoker    Hand pain, right    History of palpitations    intermittent; patient states heart skips a beat at times   Hyperlipidemia    Hypertension 11/03/2005   Insomnia    LBBB (left bundle branch block)    Mass of left lung    PAD (peripheral artery disease)    Shoulder injury 11/03/1974   Special screening for malignant neoplasms, colon    Spinal stenosis of lumbar region    Stenosis of left carotid artery     Tobacco History: Social History   Tobacco Use  Smoking Status Former   Current packs/day: 1.00   Average packs/day: 1 pack/day for 25.0 years (25.0 ttl pk-yrs)   Types: Cigarettes  Smokeless Tobacco Never   Counseling given: Not Answered   Outpatient Medications Prior to Visit  Medication Sig Dispense Refill   albuterol  (VENTOLIN  HFA) 108 (90 Base) MCG/ACT inhaler Inhale 2 puffs into the lungs every 6 (six) hours as needed for wheezing or shortness of breath. 18 g 0   ALPRAZolam  (XANAX ) 0.5 MG tablet Take  1 tablet (0.5 mg total) 3 (three) times daily as needed by mouth for anxiety. 90 tablet 2   aspirin EC 325 MG tablet Take 325 mg by mouth every 6 (six) hours as needed. For pain     budesonide -formoterol  (SYMBICORT ) 160-4.5 MCG/ACT inhaler Inhale 2 puffs into the lungs 2 (two) times daily. 1 each 12   clopidogrel  (PLAVIX ) 75 MG tablet Take 1 tablet (75 mg total) by mouth daily. 30 tablet 6   Evolocumab  (REPATHA  SURECLICK) 140 MG/ML SOAJ Inject 140 mg into the skin every 14 (fourteen) days. 6 mL 4    losartan  (COZAAR ) 100 MG tablet Take 100 mg by mouth daily.     polyethylene glycol powder (GLYCOLAX /MIRALAX ) 17 GM/SCOOP powder Take 17 g by mouth daily. (Patient taking differently: Take 17 g by mouth as needed.) 238 g 0   amLODipine  (NORVASC ) 10 MG tablet Take 10 mg by mouth every morning. (Patient not taking: Reported on 08/24/2024)     No facility-administered medications prior to visit.     Review of Systems: as above    Physical Exam:  BP 124/78   Pulse 93   Ht 5' 7 (1.702 m)   Wt 212 lb 3.2 oz (96.3 kg)   SpO2 98%   BMI 33.24 kg/m   GEN: Pleasant, interactive, well-appearing; obese; in no acute distress HEENT:  Normocephalic and atraumatic. PERRLA. Sclera white. Nasal turbinates pink, moist and patent bilaterally. No rhinorrhea present. Oropharynx pink and moist, without exudate or edema. No lesions, ulcerations, or postnasal drip.  NECK:  Supple w/ fair ROM. No lymphadenopathy.   CV: RRR, no m/r/g, no peripheral edema. Pulses intact, +2 bilaterally. No cyanosis, pallor or clubbing. PULMONARY:  Unlabored, regular breathing. Clear bilaterally A&P w/o wheezes/rales/rhonchi. No accessory muscle use.  GI: BS present and normoactive. Soft, non-tender to palpation. MSK: No erythema, warmth or tenderness.  Neuro: A/Ox3. No focal deficits noted.   Skin: Warm, no lesions or rashe Psych: Normal affect and behavior. Judgement and thought content appropriate.     Lab Results:  CBC    Component Value Date/Time   WBC 6.2 05/27/2024 0502   RBC 5.03 05/27/2024 0502   HGB 14.3 05/27/2024 0502   HGB 14.4 10/10/2015 1656   HCT 41.0 05/27/2024 0502   HCT 41.7 10/10/2015 1656   PLT 204 05/27/2024 0502   PLT 257 10/10/2015 1656   MCV 81.5 05/27/2024 0502   MCV 84 10/10/2015 1656   MCV 88 05/26/2012 0500   MCH 28.4 05/27/2024 0502   MCHC 34.9 05/27/2024 0502   RDW 13.2 05/27/2024 0502   RDW 14.8 10/10/2015 1656   RDW 13.7 05/26/2012 0500   LYMPHSABS 1,360 06/16/2017 1517    LYMPHSABS 1.7 10/10/2015 1656   LYMPHSABS 1.2 04/08/2012 1148   MONOABS 408 06/16/2017 1517   MONOABS 0.4 04/08/2012 1148   EOSABS 204 06/16/2017 1517   EOSABS 0.4 10/10/2015 1656   EOSABS 0.1 04/08/2012 1148   BASOSABS 0 06/16/2017 1517   BASOSABS 0.0 10/10/2015 1656   BASOSABS 0.1 04/08/2012 1148    BMET    Component Value Date/Time   NA 134 (L) 06/01/2024 0947   NA 141 10/10/2015 1656   NA 131 (L) 05/26/2012 0500   K 3.4 (L) 06/01/2024 0947   K 3.3 (L) 05/26/2012 0500   CL 98 06/01/2024 0947   CL 96 (L) 05/26/2012 0500   CO2 25 06/01/2024 0947   CO2 24 05/26/2012 0500   GLUCOSE 119 (H) 06/01/2024 0947  GLUCOSE 125 (H) 05/26/2012 0500   BUN 19 06/01/2024 0947   BUN 20 10/10/2015 1656   BUN 5 (L) 05/26/2012 0500   CREATININE 1.07 06/01/2024 0947   CREATININE 1.15 04/22/2016 1522   CALCIUM 9.2 06/01/2024 0947   CALCIUM 9.0 05/26/2012 0500   GFRNONAA >60 06/01/2024 0947   GFRNONAA >60 05/26/2012 0500   GFRAA 87 10/10/2015 1656   GFRAA >60 05/26/2012 0500    BNP    Component Value Date/Time   BNP 130.0 (H) 05/27/2024 0502     Imaging:  No results found.  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 Last exacerbation July 2025. Trigger prevention reviewed. Action plan in place. Encouraged to work on graded exercises.   Patient Instructions  Continue Albuterol  inhaler 2 puffs  every 6 hours as needed for shortness of breath or wheezing. Notify if symptoms persist despite rescue inhaler/neb use. Continue Symbicort  2 puffs Twice daily. Brush tongue and rinse mouth afterwards  PET scan ordered for further evaluation of the lung mass If the PET scan is concerning for a lung cancer, we will reschedule your biopsy and help arrange transportation. You will just need to find someone to stay with you for a brief period afterwards  Follow up in 6 weeks with Dr. Isaiah or Izetta Malachy PIETY. We may adjust this pending PET scan results. If symptoms do not improve or worsen, please contact office for sooner follow up or seek emergency care.    Lung mass LLL concerning for primary bronchogenic carcinoma. Prior bronchoscopy was canceled due to transportation issues. Will order PET scan for further evaluation. If this lesion is hypermetabolic without any change from prior CT, will need to move forward with bronchoscopy. Discussed with pt today who believes he will be able to coordinate someone to stay with him post procedure.   Advised if symptoms do not improve or worsen, to please contact office for sooner follow up or seek emergency care.   I spent 35 minutes of dedicated to the care of this patient on the date of this encounter to include pre-visit review of records, face-to-face time with the patient discussing conditions above, post visit ordering of testing, clinical documentation with the electronic health record, making appropriate referrals as documented, and communicating necessary findings to members of the patients care team.  Comer LULLA Malachy, NP 08/24/2024  Pt aware and understands NP's role.

## 2024-08-24 NOTE — Patient Instructions (Signed)
 Continue Albuterol  inhaler 2 puffs every 6 hours as needed for shortness of breath or wheezing. Notify if symptoms persist despite rescue inhaler/neb use. Continue Symbicort  2 puffs Twice daily. Brush tongue and rinse mouth afterwards  PET scan ordered for further evaluation of the lung mass If the PET scan is concerning for a lung cancer, we will reschedule your biopsy and help arrange transportation. You will just need to find someone to stay with you for a brief period afterwards  Follow up in 6 weeks with Dr. Isaiah or Izetta Malachy PIETY. We may adjust this pending PET scan results. If symptoms do not improve or worsen, please contact office for sooner follow up or seek emergency care.

## 2024-08-24 NOTE — Assessment & Plan Note (Signed)
 LLL concerning for primary bronchogenic carcinoma. Prior bronchoscopy was canceled due to transportation issues. Will order PET scan for further evaluation. If this lesion is hypermetabolic without any change from prior CT, will need to move forward with bronchoscopy. Discussed with pt today who believes he will be able to coordinate someone to stay with him post procedure.

## 2024-08-24 NOTE — Patient Instructions (Signed)
 Full PFT completed today ? ?

## 2024-08-24 NOTE — Progress Notes (Signed)
 Full PFT completed today ? ?

## 2024-08-30 ENCOUNTER — Ambulatory Visit
Admission: RE | Admit: 2024-08-30 | Discharge: 2024-08-30 | Disposition: A | Discharge status: Home / Self Care | Source: Ambulatory Visit | Attending: Nurse Practitioner | Admitting: Nurse Practitioner

## 2024-08-30 DIAGNOSIS — I700 Atherosclerosis of aorta: Secondary | ICD-10-CM | POA: Insufficient documentation

## 2024-08-30 DIAGNOSIS — R918 Other nonspecific abnormal finding of lung field: Secondary | ICD-10-CM | POA: Insufficient documentation

## 2024-08-30 DIAGNOSIS — J432 Centrilobular emphysema: Secondary | ICD-10-CM | POA: Diagnosis not present

## 2024-08-30 LAB — GLUCOSE, CAPILLARY: Glucose-Capillary: 86 mg/dL (ref 70–99)

## 2024-08-30 MED ORDER — FLUDEOXYGLUCOSE F - 18 (FDG) INJECTION
11.0000 | Freq: Once | INTRAVENOUS | Status: AC | PRN
  Administered 2024-08-30: 11.35 via INTRAVENOUS

## 2024-08-31 ENCOUNTER — Ambulatory Visit: Payer: Self-pay | Admitting: Nurse Practitioner

## 2024-08-31 NOTE — Telephone Encounter (Signed)
 I have notified the patient. I offered him 08/12/2024 with Dr. Isaiah to do the Bronch.

## 2024-08-31 NOTE — Telephone Encounter (Signed)
-----   Message from Comer LULLA Rouleau sent at 08/31/2024  9:14 AM EDT ----- PET scan concerning for lung cancer. There are some mildly enlarged lymph nodes but these aren't active on PET. No evidence of distant disease. Need to get him set up for bronch. See if we can get  transportation set up for him. He just needs to find someone to stay with him once he gets home, per anesthesia. Thanks.  ----- Message ----- From: Interface, Rad Results In Sent: 08/30/2024   1:06 PM EDT To: Comer Rouleau LULLA, NP

## 2024-08-31 NOTE — Progress Notes (Signed)
 PET scan concerning for lung cancer. There are some mildly enlarged lymph nodes but these aren't active on PET. No evidence of distant disease. Need to get him set up for bronch. See if we can get transportation set up for him. He just needs to find someone to stay with him once he gets home, per anesthesia. Thanks.

## 2024-09-01 NOTE — Telephone Encounter (Signed)
 Dr. Tamea can you confirm that he will still need a Robotic Bronch with EBUS?  I am going to have him scheduled for 09/12/2024 with Dr. Isaiah.

## 2024-09-01 NOTE — Telephone Encounter (Signed)
 Robotic Bronch with EBUS 09/12/2024 at 7:30am Lung Nodule [id number], [id number], [id number]   Donzell please see bronch info. And he will need to be scheduled for a Super D CT.

## 2024-09-01 NOTE — Telephone Encounter (Signed)
-----   Message from Evalene VEAR Louder, [place] sent at 08/31/2024 12:30 PM EDT -----   ----- Message ----- From: Malachy Comer GAILS, NP Sent: 08/31/2024   9:14 AM EDT To: Evalene VEAR Louder, CMA  PET scan concerning for lung cancer. There are some mildly enlarged lymph nodes but these aren't active on PET. No evidence of distant disease. Need to get him set up for bronch. See if we can get  transportation set up for him. He just needs to find someone to stay with him once he gets home, per anesthesia. Thanks.  ----- Message ----- From: Interface, Rad Results In Sent: 08/30/2024   1:06 PM EDT To: Comer Malachy GAILS, NP

## 2024-09-02 NOTE — Telephone Encounter (Signed)
-----   Message from Evalene VEAR Louder, [place] sent at 09/01/2024  4:59 PM EDT -----   ----- Message ----- From: Louder Evalene VEAR, CMA Sent: 08/31/2024  12:30 PM EDT To: Evalene VEAR Louder, CMA  ----- Message from Evalene VEAR Louder, CMA sent at 08/31/2024 12:30 PM EDT -----   ----- Message ----- From: Malachy Comer GAILS, NP Sent: 08/31/2024   9:14 AM EDT To: Evalene VEAR Louder, CMA  PET scan concerning for lung cancer. There are some mildly enlarged lymph nodes but these aren't active on PET. No evidence of distant disease. Need to get him set up for bronch. See if we can get  transportation set up for him. He just needs to find someone to stay with him once he gets home, per anesthesia. Thanks.  ----- Message ----- From: Interface, Rad Results In Sent: 08/30/2024   1:06 PM EDT To: Comer Malachy GAILS, NP

## 2024-09-02 NOTE — Telephone Encounter (Signed)
 Left message for the patient to confirm all dates and time.

## 2024-09-03 ENCOUNTER — Encounter: Payer: Self-pay | Admitting: Urgent Care

## 2024-09-06 ENCOUNTER — Inpatient Hospital Stay: Admission: RE | Admit: 2024-09-06 | Source: Ambulatory Visit

## 2024-09-06 NOTE — Telephone Encounter (Signed)
 Dr. Kasa and Izetta this is just an FYI.

## 2024-09-06 NOTE — Telephone Encounter (Addendum)
 I spoke with the patient. He is not able to have the bronch on 10/11. He does not have anyone that can stay with his after the procedure. He will call back once he has arranged to have someone stay with him.  I have canceled the procedure and James [name] has canceled the CT.  Nothing further needed.

## 2024-09-06 NOTE — Telephone Encounter (Signed)
 Copied from CRM [id number]. Topic: General - Other >> Sep 06, 2024  8:16 AM Joesph PARAS wrote: Reason for CRM: Patient is returning call to [facility] regarding biopsy. Please return call to patient.

## 2024-09-09 ENCOUNTER — Ambulatory Visit

## 2024-09-12 ENCOUNTER — Ambulatory Visit: Admit: 2024-09-12 | Admitting: Internal Medicine

## 2024-09-12 SURGERY — VIDEO BRONCHOSCOPY WITH ENDOBRONCHIAL NAVIGATION
Anesthesia: General | Laterality: Left

## 2024-09-16 ENCOUNTER — Ambulatory Visit (INDEPENDENT_AMBULATORY_CARE_PROVIDER_SITE_OTHER)

## 2024-09-16 ENCOUNTER — Ambulatory Visit (INDEPENDENT_AMBULATORY_CARE_PROVIDER_SITE_OTHER): Admitting: Vascular Surgery

## 2024-09-16 ENCOUNTER — Encounter (INDEPENDENT_AMBULATORY_CARE_PROVIDER_SITE_OTHER): Payer: Self-pay | Admitting: Vascular Surgery

## 2024-09-16 VITALS — BP 166/99 | HR 87 | Resp 18 | Wt 212.0 lb

## 2024-09-16 DIAGNOSIS — I739 Peripheral vascular disease, unspecified: Secondary | ICD-10-CM

## 2024-09-16 DIAGNOSIS — R0989 Other specified symptoms and signs involving the circulatory and respiratory systems: Secondary | ICD-10-CM | POA: Diagnosis not present

## 2024-09-16 DIAGNOSIS — I723 Aneurysm of iliac artery: Secondary | ICD-10-CM

## 2024-09-16 DIAGNOSIS — I6522 Occlusion and stenosis of left carotid artery: Secondary | ICD-10-CM | POA: Diagnosis not present

## 2024-09-16 DIAGNOSIS — E782 Mixed hyperlipidemia: Secondary | ICD-10-CM

## 2024-09-16 DIAGNOSIS — I10 Essential (primary) hypertension: Secondary | ICD-10-CM | POA: Diagnosis not present

## 2024-09-16 NOTE — Progress Notes (Signed)
 MRN : [id number]  James [name] is a 68 y.o. (05/13/1956) male who presents with chief complaint of  Chief Complaint  Patient presents with   Follow-up    3 month carotid and iliac   .  History of Present Illness: Patient returns today in follow up of multiple vascular issues.  He is doing well today.  He denies any focal neurologic symptoms.  His carotid duplex today shows velocities on the right side that actually fall in the 1 to 39% range but calcific shadowing may be reducing this.  His velocities on the left side are consistent with a greater then 50% stenosis in the common carotid artery and would suggest moderate disease likely in the 60 to 70% range.  This would correlate with his CT angiogram from earlier this year. He is also study today with aortoiliac duplex.  No lifestyle-limiting claudication or aneurysm related symptoms. Specifically, the patient denies new back or abdominal pain, or signs of peripheral embolization. Duplex today shows the maximal diameter of the aorta to be 2.1 cm.  The right common iliac artery is less than a centimeter.  The left common iliac artery is mildly aneurysmal and measures 1.5 cm in maximal diameter.  This is well below the threshold for prophylactic repair and he has no symptoms from this.  There is not appear to be significant associated occlusive disease in the aorta and iliac segments by duplex.  Current Outpatient Medications  Medication Sig Dispense Refill   albuterol  (VENTOLIN  HFA) 108 (90 Base) MCG/ACT inhaler Inhale 2 puffs into the lungs every 6 (six) hours as needed for wheezing or shortness of breath. 18 g 0   ALPRAZolam  (XANAX ) 0.5 MG tablet Take 1 tablet (0.5 mg total) 3 (three) times daily as needed by mouth for anxiety. 90 tablet 2   aspirin EC 325 MG tablet Take 325 mg by mouth every 6 (six) hours as needed. For pain     budesonide -formoterol  (SYMBICORT ) 160-4.5 MCG/ACT inhaler Inhale 2 puffs into the lungs 2 (two) times daily. 1  each 12   clopidogrel  (PLAVIX ) 75 MG tablet Take 1 tablet (75 mg total) by mouth daily. 30 tablet 6   Evolocumab  (REPATHA  SURECLICK) 140 MG/ML SOAJ Inject 140 mg into the skin every 14 (fourteen) days. 6 mL 4   losartan  (COZAAR ) 100 MG tablet Take 100 mg by mouth daily.     polyethylene glycol powder (GLYCOLAX /MIRALAX ) 17 GM/SCOOP powder Take 17 g by mouth daily. (Patient taking differently: Take 17 g by mouth as needed.) 238 g 0   amLODipine  (NORVASC ) 10 MG tablet Take 10 mg by mouth every morning. (Patient not taking: Reported on 09/16/2024)     No current facility-administered medications for this visit.    Past Medical History:  Diagnosis Date   Abdominal bruit    Anxiety    Chronic anticoagulation    COPD (chronic obstructive pulmonary disease) (HCC)    Depression    Eyes sensitive to light    due to side effect of manufactured version of Gabapentin    Family history of colonic polyps    Former smoker    Hand pain, right    History of palpitations    intermittent; patient states heart skips a beat at times   Hyperlipidemia    Hypertension 11/03/2005   Insomnia    LBBB (left bundle branch block)    Mass of left lung    PAD (peripheral artery disease)    Shoulder injury 11/03/1974  Special screening for malignant neoplasms, colon    Spinal stenosis of lumbar region    Stenosis of left carotid artery     Past Surgical History:  Procedure Laterality Date   CARDIAC CATHETERIZATION  2007   COLONOSCOPY  2013   KNEE SURGERY Left 1979   POLYPECTOMY  2013   TONSILLECTOMY  1964     Social History   Tobacco Use   Smoking status: Former    Current packs/day: 1.00    Average packs/day: 1 pack/day for 25.0 years (25.0 ttl pk-yrs)    Types: Cigarettes   Smokeless tobacco: Never  Vaping Use   Vaping status: Never Used  Substance Use Topics   Alcohol use: No    Alcohol/week: 0.0 standard drinks of alcohol   Drug use: No      Family History  Problem Relation Age of  Onset   Arthritis Mother    Heart failure Mother    Hypertension Mother    Kidney failure Mother    Colon cancer Father    Cancer Father        colon   Transient ischemic attack Father        history of    Allergies  Allergen Reactions   Naprosyn [Naproxen] Itching     REVIEW OF SYSTEMS (Negative unless checked)   Constitutional: [] Weight loss  [] Fever  [] Chills Cardiac: [] Chest pain   [] Chest pressure   [x] Palpitations   [] Shortness of breath when laying flat   [] Shortness of breath at rest   [] Shortness of breath with exertion. Vascular:  [] Pain in legs with walking   [] Pain in legs at rest   [] Pain in legs when laying flat   [x] Claudication   [] Pain in feet when walking  [] Pain in feet at rest  [] Pain in feet when laying flat   [] History of DVT   [] Phlebitis   [] Swelling in legs   [] Varicose veins   [] Non-healing ulcers Pulmonary:   [] Uses home oxygen   [] Productive cough   [] Hemoptysis   [] Wheeze  [] COPD   [] Asthma Neurologic:  [] Dizziness  [] Blackouts   [] Seizures   [] History of stroke   [] History of TIA  [] Aphasia   [] Temporary blindness   [] Dysphagia   [] Weakness or numbness in arms   [] Weakness or numbness in legs Musculoskeletal:  [x] Arthritis   [] Joint swelling   [] Joint pain   [] Low back pain Hematologic:  [] Easy bruising  [] Easy bleeding   [] Hypercoagulable state   [] Anemic  [] Hepatitis Gastrointestinal:  [] Blood in stool   [] Vomiting blood  [] Gastroesophageal reflux/heartburn   [] Abdominal pain Genitourinary:  [] Chronic kidney disease   [] Difficult urination  [] Frequent urination  [] Burning with urination   [] Hematuria Skin:  [] Rashes   [] Ulcers   [] Wounds Psychological:  [x] History of anxiety   [x]  History of major depression.  Physical Examination  BP (!) 166/99 (BP Location: Left Arm)   Pulse 87   Resp 18   Wt 212 lb (96.2 kg)   BMI 33.20 kg/m  Gen:  WD/WN, NAD Head: [place]/AT, No temporalis wasting. Ear/Nose/Throat: Hearing grossly intact, nares w/o erythema or  drainage Eyes: Conjunctiva clear. Sclera non-icteric Neck: Supple.  Trachea midline Pulmonary:  Good air movement, no use of accessory muscles.  Cardiac: RRR, no JVD Vascular:  Vessel Right Left  Radial Palpable Palpable           Musculoskeletal: M/S 5/5 throughout.  No deformity or atrophy. No edema. Neurologic: Sensation grossly intact in extremities.  Symmetrical.  Speech  is fluent.  Psychiatric: Judgment intact, Mood & affect appropriate for pt's clinical situation. Dermatologic: No rashes or ulcers noted.  No cellulitis or open wounds.      Labs Recent Results (from the past 2160 hours)  Pulmonary function test     Status: None   Collection Time: 08/24/24  1:34 PM  Result Value Ref Range   FVC-Pre 3.69 L   FVC-%Pred-Pre 92 %   FVC-Post 3.72 L   FVC-%Pred-Post 93 %   FVC-%Change-Post 0 %   FEV1-Pre 2.57 L   FEV1-%Pred-Pre 88 %   FEV1-Post 2.55 L   FEV1-%Pred-Post 87 %   FEV1-%Change-Post 0 %   FEV6-Pre 3.66 L   FEV6-%Pred-Pre 98 %   FEV6-Post 3.69 L   FEV6-%Pred-Post 98 %   FEV6-%Change-Post 0 %   Pre FEV1/FVC ratio 70 %   FEV1FVC-%Pred-Pre 94 %   Post FEV1/FVC ratio 68 %   FEV1FVC-%Change-Post -1 %   Pre FEV6/FVC Ratio 99 %   FEV6FVC-%Pred-Pre 105 %   Post FEV6/FVC ratio 99 %   FEV6FVC-%Pred-Post 105 %   FEV6FVC-%Change-Post 0 %   FEF 25-75 Pre 1.58 L/sec   FEF2575-%Pred-Pre 70 %   FEF 25-75 Post 1.50 L/sec   FEF2575-%Pred-Post 66 %   FEF2575-%Change-Post -4 %   RV 2.50 L   RV % pred 110 %   TLC 6.59 L   TLC % pred 102 %   DLCO unc 19.69 ml/min/mmHg   DLCO unc % pred 83 %   DL/VA 6.58 ml/min/mmHg/L   DL/VA % pred 83 %  Glucose, capillary     Status: None   Collection Time: 08/30/24 11:20 AM  Result Value Ref Range   Glucose-Capillary 86 70 - 99 mg/dL    Comment: Glucose reference range applies only to samples taken after fasting for at least 8 hours.    Radiology NM PET Image Initial (PI) Skull Base To Thigh (F-18 FDG) Result Date:  08/30/2024 CLINICAL DATA:  Initial treatment strategy for left lower lobe mass on chest CT. EXAM: NUCLEAR MEDICINE PET SKULL BASE TO THIGH TECHNIQUE: 11.35 mCi F-18 FDG was injected intravenously. Full-ring PET imaging was performed from the skull base to thigh after the radiotracer. CT data was obtained and used for attenuation correction and anatomic localization. Fasting blood glucose: 86 mg/dl COMPARISON:  Chest CT 90/97/7974 FINDINGS: Mediastinal blood pool activity: SUV max 2.3 NECK: No hypermetabolic cervical lymph nodes are identified. No suspicious activity identified within the pharyngeal mucosal space. Incidental CT findings: none CHEST: The previously demonstrated left infrahilar lower lobe mass is markedly hypermetabolic. This mass is lobulated, measuring 4.4 x 3.9 cm on image 72/6 and has an SUV max of 16.6. No hypermetabolic pulmonary activity or suspicious pulmonary nodularity elsewhere. There are small mediastinal lymph nodes with low level metabolic activity, including a right paratracheal node with an SUV max of 3.1, an AP window node with an SUV max of 2.5 and a left infrahilar node with an SUV max of 4.4. Incidental CT findings: Centrilobular and paraseptal emphysema. No significant pleural effusion. ABDOMEN/PELVIS: There is no hypermetabolic activity within the liver, adrenal glands, spleen or pancreas. There is no hypermetabolic nodal activity in the abdomen or pelvis. Incidental CT findings: There are multiple calcified gallstones. There are bilateral renal cysts for which no specific follow-up imaging is recommended. Mild aortic and branch vessel atherosclerosis. The prostate gland is mildly enlarged, and there is mild resulting bladder trabeculation on the right. SKELETON: There is no hypermetabolic activity to  suggest osseous metastatic disease. Incidental CT findings: Mild spondylosis. IMPRESSION: 1. The previously demonstrated left lower lobe mass is markedly hypermetabolic, consistent  with bronchogenic carcinoma. 2. There are small mediastinal and left infrahilar lymph nodes with low level metabolic activity, nonspecific. 3. No evidence of distant metastatic disease. 4. Aortic Atherosclerosis (ICD10-I70.0) and Emphysema (ICD10-J43.9). Electronically Signed   By: Elsie Perone M.D.   On: 08/30/2024 13:03    Assessment/Plan  Stenosis of left carotid artery  His carotid duplex today shows velocities on the right side that actually fall in the 1 to 39% range but calcific shadowing may be reducing this.  His velocities on the left side are consistent with a greater then 50% stenosis in the common carotid artery and would suggest moderate disease likely in the 60 to 70% range.  This would correlate with his CT angiogram from earlier this year. No focal neurologic symptoms.  Continue aspirin, Plavix , and Repatha .  We will follow this on 40-month intervals unless there is progression where we would then consider intervention.  Iliac artery aneurysm Duplex today shows the maximal diameter of the aorta to be 2.1 cm.  The right common iliac artery is less than a centimeter.  The left common iliac artery is mildly aneurysmal and measures 1.5 cm in maximal diameter.  This is well below the threshold for prophylactic repair and he has no symptoms from this.  There is not appear to be significant associated occlusive disease in the aorta and iliac segments by duplex.  I would just plan to follow this on an annual basis with duplex.  Hypertension blood pressure control important in reducing the progression of atherosclerotic disease and aneurysmal growth. On appropriate oral medications.     Hyperlipidemia On repatha . lipid control important in reducing the progression of atherosclerotic disease.  Selinda Gu, MD  09/16/2024 9:52 AM    This note was created with Dragon medical transcription system.  Any errors from dictation are purely unintentional

## 2024-09-16 NOTE — Assessment & Plan Note (Signed)
 Duplex today shows the maximal diameter of the aorta to be 2.1 cm.  The right common iliac artery is less than a centimeter.  The left common iliac artery is mildly aneurysmal and measures 1.5 cm in maximal diameter.  This is well below the threshold for prophylactic repair and he has no symptoms from this.  There is not appear to be significant associated occlusive disease in the aorta and iliac segments by duplex.  I would just plan to follow this on an annual basis with duplex.

## 2024-09-16 NOTE — Assessment & Plan Note (Signed)
 His carotid duplex today shows velocities on the right side that actually fall in the 1 to 39% range but calcific shadowing may be reducing this.  His velocities on the left side are consistent with a greater then 50% stenosis in the common carotid artery and would suggest moderate disease likely in the 60 to 70% range.  This would correlate with his CT angiogram from earlier this year. No focal neurologic symptoms.  Continue aspirin, Plavix , and Repatha .  We will follow this on 14-month intervals unless there is progression where we would then consider intervention.

## 2024-10-13 ENCOUNTER — Other Ambulatory Visit: Payer: Self-pay | Admitting: Cardiovascular Disease

## 2024-10-13 ENCOUNTER — Other Ambulatory Visit: Payer: Self-pay

## 2024-10-13 DIAGNOSIS — E782 Mixed hyperlipidemia: Secondary | ICD-10-CM

## 2024-10-13 DIAGNOSIS — I739 Peripheral vascular disease, unspecified: Secondary | ICD-10-CM

## 2024-10-13 DIAGNOSIS — I6522 Occlusion and stenosis of left carotid artery: Secondary | ICD-10-CM

## 2024-10-14 ENCOUNTER — Other Ambulatory Visit: Payer: Self-pay

## 2024-10-14 MED FILL — Evolocumab Subcutaneous Soln Auto-Injector 140 MG/ML: SUBCUTANEOUS | 84 days supply | Qty: 6 | Fill #0 | Status: AC

## 2024-10-17 ENCOUNTER — Ambulatory Visit: Admitting: Nurse Practitioner

## 2024-10-17 ENCOUNTER — Other Ambulatory Visit: Payer: Self-pay

## 2024-10-17 ENCOUNTER — Encounter: Payer: Self-pay | Admitting: Nurse Practitioner

## 2024-10-17 ENCOUNTER — Ambulatory Visit: Payer: Self-pay | Admitting: Internal Medicine

## 2024-10-17 VITALS — BP 150/90 | HR 91 | Temp 97.8°F | Ht 67.0 in | Wt 221.0 lb

## 2024-10-17 DIAGNOSIS — R918 Other nonspecific abnormal finding of lung field: Secondary | ICD-10-CM

## 2024-10-17 DIAGNOSIS — K219 Gastro-esophageal reflux disease without esophagitis: Secondary | ICD-10-CM | POA: Diagnosis not present

## 2024-10-17 DIAGNOSIS — J449 Chronic obstructive pulmonary disease, unspecified: Secondary | ICD-10-CM | POA: Diagnosis not present

## 2024-10-17 DIAGNOSIS — R062 Wheezing: Secondary | ICD-10-CM

## 2024-10-17 MED ORDER — OMEPRAZOLE 40 MG PO CPDR: 40.0000 mg | DELAYED_RELEASE_CAPSULE | Freq: Every day | ORAL | 5 refills | Status: AC

## 2024-10-17 MED ORDER — ALBUTEROL SULFATE HFA 108 (90 BASE) MCG/ACT IN AERS: 2.0000 | INHALATION_SPRAY | Freq: Four times a day (QID) | RESPIRATORY_TRACT | 1 refills | Status: AC | PRN

## 2024-10-17 NOTE — Patient Instructions (Addendum)
 Continue Albuterol  inhaler 2 puffs every 6 hours as needed for shortness of breath or wheezing. Notify if symptoms persist despite rescue inhaler/neb use. Continue Symbicort  2 puffs Twice daily. Brush tongue and rinse mouth afterwards   Your PET scan is highly concerning for lung cancer. We need to get the bronchoscopy done. Understand the need to wait until January. I will have someone call you to get things arranaged  For the reflux symptoms, we will start you on omeprazole  1 tab daily, take 30 minutes prior to breakfast in the morning. Do not take at the same time as your Plavix . Would recommend the plavix  at bedtime to separate. Use this consistently for the next 4-6 weeks then as needed for reflux/heart burn  Call me if you're still having increased difficulties with your breathing and we could always try some prednisone     Follow up in 6 weeks after bronchoscopy with Dr. Isaiah. If symptoms do not improve or worsen, please contact office for sooner follow up or seek emergency care

## 2024-10-17 NOTE — Progress Notes (Signed)
 @Patient  ID: James [name] Alert, male    DOB: 16-Feb-1956, 68 y.o.   MRN: [id number]  Chief Complaint  Patient presents with   Shortness of Breath    Shortness of breath on exertion. Using Symbicort  daily. Albuterol  once a day.     Referring provider: Donal Channing SQUIBB, FNP  HPI: 68 year old male, former smoker followed for lung mass and COPD. He is a patient of Dr. Jacqulyn and last seen in office 08/24/2024 by [facility] NP. Past medical history significant for PAD, LBBB, HTN, insomnia, HLD, anxiety.   TEST/EVENTS:  Reviewed today, 08/24/2024 07/05/2024 CT chest: atherosclerosis. Emphysema. 3.9x2.5 cm lobulated mass in LLL, concerning for bronchogenic carcinoma. Associated occlusion of basal bronchus. Scattered micronodularity throughout LLL, likely postobstructive pna.  08/24/2024 PFT: FVC 92, FEV1 88, ratio 68, TLC 102, DLCO 83. No BD. Mild COPD  07/21/2024: OV with Dr. Isaiah. Recent AECOPD. CT chest was obtained in the ED, concerning for lung mass in LLL. Has LLE swelling, concerning for DVT. COPD is stable. Highly suggestive of lung cancer on CT chest. Plan for bronchoscopy for definitive diagnosis. Pt will need US  LLE.   08/24/2024: OV with Loyde Orth NP Patient presents today for follow-up.  He was scheduled for a bronchoscopy to evaluate a lung mass, but the procedure was not completed due to transportation issues post-procedure. This has not been rescheduled.  The doppler exam he had was negative for DVT. Swelling has improved.  He uses Symbicort  twice daily and albuterol  infrequently, only a couple of times a month. No significant cough, hemoptysis, or unexplained weight loss, or anorexia. No wheezing, chest congestion. No chest pain. Breathing feels stable.   10/17/2024: Today - follow up Discussed the use of AI scribe software for clinical note transcription with the patient, who gave verbal consent to proceed.  History of Present Illness James [name] is a 68 year old male who presents for  follow up.   He experiences variable breathing issues, including shortness of breath, which he attributes to stress and anxiety. About a week to ten days ago, a stressful incident exacerbated his anxiety and affected his breathing. His breathing was stable until the recent stressful event. Over the last few days though, he's felt like it's been better. No issue walking from the parking lot to the building.  He reports coughing up a small amount of blood about a month ago and also had sinus symptoms and a sore throat at the time. Since then, symptoms have resolved. No recurrent hemoptysis. Currently, his cough is minimal aside from occasionally clearing his throat. He does occasionally get a mild nosebleed so isn't sure if that's what the previous hemoptysis was from. The heat dries him out. No fevers, chills, night sweats, anorexia, wheezing.   He's had some recent epigastric discomfort and increased burping, suspecting it might be related to reflux. He has not been taking any medication for this. No chest pain, difficulties swallowing, changes in bowel habits, N/V, voice hoarseness. He reports weight gain due to comfort eating, especially during meals when he is not hungry.  He enjoys walking and other activities, and finds it relaxing not to have hectic holiday activities.  He canceled his last two bronchoscopy procedures. He does feel  he's at a point that he could do this in January.     Allergies  Allergen Reactions   Naprosyn [Naproxen] Itching    Immunization History  Administered Date(s) Administered   Influenza,inj,Quad PF,6+ Mos 11/13/2015, 11/25/2016   Influenza-Unspecified  08/10/2017, 07/04/2024   Tdap 05/27/2016    Past Medical History:  Diagnosis Date   Abdominal bruit    Anxiety    Chronic anticoagulation    COPD (chronic obstructive pulmonary disease) (HCC)    Depression    Eyes sensitive to light    due to side effect of manufactured version of Gabapentin    Family  history of colonic polyps    Former smoker    Hand pain, right    History of palpitations    intermittent; patient states heart skips a beat at times   Hyperlipidemia    Hypertension 11/03/2005   Insomnia    LBBB (left bundle branch block)    Mass of left lung    PAD (peripheral artery disease)    Shoulder injury 11/03/1974   Special screening for malignant neoplasms, colon    Spinal stenosis of lumbar region    Stenosis of left carotid artery     Tobacco History: Social History   Tobacco Use  Smoking Status Former   Current packs/day: 1.00   Average packs/day: 1 pack/day for 25.0 years (25.0 ttl pk-yrs)   Types: Cigarettes  Smokeless Tobacco Never   Counseling given: Not Answered   Outpatient Medications Prior to Visit  Medication Sig Dispense Refill   albuterol  (VENTOLIN  HFA) 108 (90 Base) MCG/ACT inhaler Inhale 2 puffs into the lungs every 6 (six) hours as needed for wheezing or shortness of breath. 17 g 1   ALPRAZolam  (XANAX ) 0.5 MG tablet Take 1 tablet (0.5 mg total) 3 (three) times daily as needed by mouth for anxiety. 90 tablet 2   aspirin EC 325 MG tablet Take 325 mg by mouth every 6 (six) hours as needed. For pain     budesonide -formoterol  (SYMBICORT ) 160-4.5 MCG/ACT inhaler Inhale 2 puffs into the lungs 2 (two) times daily. 1 each 12   clopidogrel  (PLAVIX ) 75 MG tablet Take 1 tablet (75 mg total) by mouth daily. 30 tablet 6   Evolocumab  (REPATHA  SURECLICK) 140 MG/ML SOAJ Inject 140 mg into the skin every 14 (fourteen) days. 6 mL 1   losartan  (COZAAR ) 100 MG tablet Take 100 mg by mouth daily.     polyethylene glycol powder (GLYCOLAX /MIRALAX ) 17 GM/SCOOP powder Take 17 g by mouth daily. (Patient taking differently: Take 17 g by mouth as needed for moderate constipation.) 238 g 0   amLODipine  (NORVASC ) 10 MG tablet Take 10 mg by mouth every morning. (Patient not taking: Reported on 10/17/2024)     No facility-administered medications prior to visit.     Review of  Systems: as above    Physical Exam:  BP (!) 150/90   Pulse 91   Temp 97.8 F (36.6 C) (Temporal)   Ht 5' 7 (1.702 m)   Wt 221 lb (100.2 kg)   SpO2 99%   BMI 34.61 kg/m   GEN: Pleasant, interactive, well-appearing; obese; in no acute distress HEENT:  Normocephalic and atraumatic. PERRLA. Sclera white. Nasal turbinates pink, moist and patent bilaterally. No rhinorrhea present. Oropharynx pink and moist, without exudate or edema. No lesions, ulcerations, or postnasal drip.  NECK:  Supple w/ fair ROM. No lymphadenopathy.   CV: RRR, no m/r/g, no peripheral edema. Pulses intact, +2 bilaterally. No cyanosis, pallor or clubbing. PULMONARY:  Unlabored, regular breathing. Clear bilaterally A&P w/o wheezes/rales/rhonchi. No accessory muscle use.  GI: BS present and normoactive. Soft, non-tender to palpation. MSK: No erythema, warmth or tenderness.  Neuro: A/Ox3. No focal deficits noted.   Skin: Warm,  no lesions or rashe Psych: Normal affect and behavior. Judgement and thought content appropriate.     Lab Results:  CBC    Component Value Date/Time   WBC 6.2 05/27/2024 0502   RBC 5.03 05/27/2024 0502   HGB 14.3 05/27/2024 0502   HGB 14.4 10/10/2015 1656   HCT 41.0 05/27/2024 0502   HCT 41.7 10/10/2015 1656   PLT 204 05/27/2024 0502   PLT 257 10/10/2015 1656   MCV 81.5 05/27/2024 0502   MCV 84 10/10/2015 1656   MCV 88 05/26/2012 0500   MCH 28.4 05/27/2024 0502   MCHC 34.9 05/27/2024 0502   RDW 13.2 05/27/2024 0502   RDW 14.8 10/10/2015 1656   RDW 13.7 05/26/2012 0500   LYMPHSABS 1,360 06/16/2017 1517   LYMPHSABS 1.7 10/10/2015 1656   LYMPHSABS 1.2 04/08/2012 1148   MONOABS 408 06/16/2017 1517   MONOABS 0.4 04/08/2012 1148   EOSABS 204 06/16/2017 1517   EOSABS 0.4 10/10/2015 1656   EOSABS 0.1 04/08/2012 1148   BASOSABS 0 06/16/2017 1517   BASOSABS 0.0 10/10/2015 1656   BASOSABS 0.1 04/08/2012 1148    BMET    Component Value Date/Time   NA 134 (L) 06/01/2024 0947    NA 141 10/10/2015 1656   NA 131 (L) 05/26/2012 0500   K 3.4 (L) 06/01/2024 0947   K 3.3 (L) 05/26/2012 0500   CL 98 06/01/2024 0947   CL 96 (L) 05/26/2012 0500   CO2 25 06/01/2024 0947   CO2 24 05/26/2012 0500   GLUCOSE 119 (H) 06/01/2024 0947   GLUCOSE 125 (H) 05/26/2012 0500   BUN 19 06/01/2024 0947   BUN 20 10/10/2015 1656   BUN 5 (L) 05/26/2012 0500   CREATININE 1.07 06/01/2024 0947   CREATININE 1.15 04/22/2016 1522   CALCIUM 9.2 06/01/2024 0947   CALCIUM 9.0 05/26/2012 0500   GFRNONAA >60 06/01/2024 0947   GFRNONAA >60 05/26/2012 0500   GFRAA 87 10/10/2015 1656   GFRAA >60 05/26/2012 0500    BNP    Component Value Date/Time   BNP 130.0 (H) 05/27/2024 0502     Imaging:  No results found.  Administration History     None          Latest Ref Rng & Units 08/24/2024    1:34 PM  PFT Results  FVC-Pre L 3.69   FVC-Predicted Pre % 92   FVC-Post L 3.72   FVC-Predicted Post % 93   Pre FEV1/FVC % % 70   Post FEV1/FCV % % 68   FEV1-Pre L 2.57   FEV1-Predicted Pre % 88   FEV1-Post L 2.55   DLCO uncorrected ml/min/mmHg 19.69   DLCO UNC% % 83   DLVA Predicted % 83   TLC L 6.59   TLC % Predicted % 102   RV % Predicted % 110     No results found for: NITRICOXIDE      Assessment & Plan:   COPD, mild (HCC) 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 Last exacerbation July 2025. Slight increase in dyspnea seems to be anxiety driven. Clinically improved the last few days. Lung exam benign. Advsied to call if symptoms return. Trigger prevention reviewed. Action plan in place. Encouraged to work on graded exercises.   Patient Instructions  Continue Albuterol  inhaler 2 puffs every 6 hours as needed for shortness of breath or wheezing. Notify if symptoms persist despite rescue inhaler/neb use. Continue Symbicort  2 puffs Twice daily. Brush tongue and rinse mouth afterwards   Your PET  scan is highly concerning for lung cancer. We need to get the bronchoscopy  done. Understand the need to wait until January. I will have someone call you to get things arranaged  For the reflux symptoms, we will start you on omeprazole  1 tab daily, take 30 minutes prior to breakfast in the morning. Do not take at the same time as your Plavix . Would recommend the plavix  at bedtime to separate. Use this consistently for the next 4-6 weeks then as needed for reflux/heart burn  Call me if you're still having increased difficulties with your breathing and we could always try some prednisone     Follow up in 6 weeks after bronchoscopy with Dr. Isaiah. If symptoms do not improve or worsen, please contact office for sooner follow up or seek emergency care   Lung mass LLL concerning for primary bronchogenic carcinoma. Hypermetabolic on recent PET. Prior bronchoscopy was canceled due to transportation issues. Reviewed importance of obtaining tissue sampling today. Verbalized understanding and risks of postponing. Will set him up with bronchoscopy in January.   GERD (gastroesophageal reflux disease) GERD symptoms. Will start short course of PPI. Side effect profile reviewed. Reviewed GERD precautions. No red flag symptoms.    Advised if symptoms do not improve or worsen, to please contact office for sooner follow up or seek emergency care.   I spent 35 minutes of dedicated to the care of this patient on the date of this encounter to include pre-visit review of records, face-to-face time with the patient discussing conditions above, post visit ordering of testing, clinical documentation with the electronic health record, making appropriate referrals as documented, and communicating necessary findings to members of the patients care team.  Comer LULLA Rouleau, NP 10/17/2024  Pt aware and understands NP's role.

## 2024-10-17 NOTE — Assessment & Plan Note (Signed)
 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 Last exacerbation July 2025. Slight increase in dyspnea seems to be anxiety driven. Clinically improved the last few days. Lung exam benign. Advsied to call if symptoms return. Trigger prevention reviewed. Action plan in place. Encouraged to work on graded exercises.   Patient Instructions  Continue Albuterol  inhaler 2 puffs every 6 hours as needed for shortness of breath or wheezing. Notify if symptoms persist despite rescue inhaler/neb use. Continue Symbicort  2 puffs Twice daily. Brush tongue and rinse mouth afterwards   Your PET scan is highly concerning for lung cancer. We need to get the bronchoscopy done. Understand the need to wait until January. I will have someone call you to get things arranaged  For the reflux symptoms, we will start you on omeprazole  1 tab daily, take 30 minutes prior to breakfast in the morning. Do not take at the same time as your Plavix . Would recommend the plavix  at bedtime to separate. Use this consistently for the next 4-6 weeks then as needed for reflux/heart burn  Call me if you're still having increased difficulties with your breathing and we could always try some prednisone     Follow up in 6 weeks after bronchoscopy with Dr. Isaiah. If symptoms do not improve or worsen, please contact office for sooner follow up or seek emergency care

## 2024-10-17 NOTE — Assessment & Plan Note (Signed)
 LLL concerning for primary bronchogenic carcinoma. Hypermetabolic on recent PET. Prior bronchoscopy was canceled due to transportation issues. Reviewed importance of obtaining tissue sampling today. Verbalized understanding and risks of postponing. Will set him up with bronchoscopy in January.

## 2024-10-17 NOTE — Assessment & Plan Note (Signed)
 GERD symptoms. Will start short course of PPI. Side effect profile reviewed. Reviewed GERD precautions. No red flag symptoms.

## 2024-10-17 NOTE — Telephone Encounter (Signed)
 Patient reports shortness of breath is worsening. Using Symbicort  2 puffs, twice a day. Albuterol  once a day. Requesting refill on albuterol . Patient reports nurse triage, advised to be seen this week. Patient upset that we canceled and rescheduled with Dr. Isaiah. Please advise.  Patient is aware that he needs to follow up with Dr. Isaiah, due to needing to schedule bronchoscopy. That he has canceled twice.   Per izetta, patient to come in at 2:30 pm. Patient advised. NFN.

## 2024-10-17 NOTE — Telephone Encounter (Signed)
 FYI Only or Action Required?: Action required by provider: request for appointment.  Patient is followed in Pulmonology for COPD, last seen on 08/24/2024 by Malachy Comer GAILS, NP.  Called Nurse Triage reporting Shortness of Breath.  Symptoms began [name] week ago.  Interventions attempted: Rescue inhaler and Maintenance inhaler.  Symptoms are: unchanged.  Triage Disposition: See HCP Within 4 Hours (Or PCP Triage)  Patient/caregiver understands and will follow disposition?: Call to office- patient had appointment scheduled for today- but it was canceled by office- appointment is still available- not sure why canceled. Request note for review and patient call back.   James [name] Pulmonary Triage - Initial Assessment Questions Chief Complaint (e.g., cough, sob, wheezing, fever, chills, sweat or additional symptoms) *Go to specific symptom protocol after initial questions. SOB  How long have symptoms been present? 1 week ago  Have you tested for COVID or Flu? Note: If not, ask patient if [name] home test can be taken. If so, instruct patient to call back for positive results. No  MEDICINES:   Have you used any OTC meds to help with symptoms? No If yes, ask What medications? Tylenol  only  Have you used your inhalers/maintenance medication? Yes If yes, What medications? Symbicort - bid. Albuterol  inhaler- using more often- once daily  If inhaler, ask How many puffs and how often? Note: Review instructions on medication in the chart. See above  OXYGEN: Do you wear supplemental oxygen? No If yes, How many liters are you supposed to use? na  Do you monitor your oxygen levels? No If yes, What is your reading (oxygen level) today? no  What is your usual oxygen saturation reading?  (Note: Pulmonary O2 sats should be 90% or greater) Always been good- high 90   Copied from CRM #[id number]. Topic: Clinical - Red Word Triage >> Oct 17, 2024 10:12 AM James [name] wrote: James [name]  that prompted transfer to Nurse Triage: more SOB than usual Reason for Disposition  [1] Longstanding difficulty breathing (e.g., CHF, COPD, emphysema) AND [2] WORSE than normal  Answer Assessment - Initial Assessment Questions Patient states he is having increased SOB with exertion for 1 week now. He saw intruder trespassing on his property and had words with him- since that day patient has had increased SOB with exertion and pressure midline that comes and goes. He describes this as mild and goes away with rest and calm. Patient is not sure if caused by anxiety and stress that day- but has continued daily and is requesting to be checked.    1. RESPIRATORY STATUS: Describe your breathing? (e.g., wheezing, shortness of breath, unable to speak, severe coughing)      Periods of SOB with exertion- seems to be worse than normal, described more pressure- not huffing and puffing 2. ONSET: When did this breathing problem begin?      1 week 3. PATTERN Does the difficult breathing come and go, or has it been constant since it started?      Comes and goes, daily- lasting until patient can sit and rest 4. SEVERITY: How bad is your breathing? (e.g., mild, moderate, severe)      mild 5. RECURRENT SYMPTOM: Have you had difficulty breathing before? If Yes, ask: When was the last time? and What happened that time?      Yes- but not this bad 6. CARDIAC HISTORY: Do you have any history of heart disease? (e.g., heart attack, angina, bypass surgery, angioplasty)      no 7. LUNG HISTORY: Do you  have any history of lung disease?  (e.g., pulmonary embolus, asthma, emphysema)     COPD 8. CAUSE: What do you think is causing the breathing problem?      Increased anxiety 9. OTHER SYMPTOMS: Do you have any other symptoms? (e.g., chest pain, cough, dizziness, fever, runny nose)     Cough- slight  Protocols used: Breathing Difficulty-[name]-AH

## 2024-12-07 ENCOUNTER — Ambulatory Visit: Admitting: Internal Medicine

## 2024-12-08 ENCOUNTER — Ambulatory Visit: Admitting: Internal Medicine

## 2024-12-08 ENCOUNTER — Encounter: Payer: Self-pay | Admitting: Internal Medicine

## 2024-12-08 VITALS — BP 122/80 | HR 79 | Temp 98.7°F | Ht 67.0 in | Wt 223.0 lb

## 2024-12-08 DIAGNOSIS — R918 Other nonspecific abnormal finding of lung field: Secondary | ICD-10-CM

## 2024-12-08 DIAGNOSIS — J449 Chronic obstructive pulmonary disease, unspecified: Secondary | ICD-10-CM

## 2024-12-08 NOTE — Progress Notes (Unsigned)
 " [facility] [place] Pulmonary Medicine Consultation      Date: 12/08/2024,   MRN# [id number] James [name] [birth year]   CHIEF COMPLAINT:   Assessment COPD Assessment lung mass   HISTORY OF PRESENT ILLNESS  Patient with a recent COPD exacerbation in the emergency room with hyponatremia CT of the chest obtained September 2025 Subsequent PET scan October 2025 she has mediastinal adenopathy with left lung mass increased uptake  This is definitely concerning for malignancy Over the past several months patient has refused to get procedures done due to transportation issues  Patient still has not certain of the procedure date and time Patient states he has a friend that willing to take care of his responsibilities while he undergoes a procedure We will await his correspondence regarding when he would like to proceed with bronchoscopy   Patient currently on Plavix  for carotid artery stenosis    CT chest September 2025 reviewed in detail with patient today Strong concern for left lung mass consistent with malignancy      Assessment of Dizziness Carotid Doppler showed significant noncalcified plaque in the mid left common carotid artery in the neck with elevated velocities. Stenosis may be 70% or less.  Greater based on velocities.   Correlation with CT angiography of the neck may be helpful. Mild plaque in the distal right carotid bulb and proximal right ICA. Estimated right ICA stenosis is less than 50%.  MRI of the brain was negative for an acute stroke CT angiogram showed low-density soft plaque in the distal Left CCA, with 50% stenosis. No hemodynamically significant arterial stenosis.  Follow-up with vascular surgery as an outpatient PAST MEDICAL HISTORY   Past Medical History:  Diagnosis Date   Abdominal bruit    Anxiety    Chronic anticoagulation    COPD (chronic obstructive pulmonary disease) (HCC)    Depression    Eyes sensitive to light    due to side effect of  manufactured version of Gabapentin    Family history of colonic polyps    Former smoker    Hand pain, right    History of palpitations    intermittent; patient states heart skips a beat at times   Hyperlipidemia    Hypertension 11/03/2005   Insomnia    LBBB (left bundle branch block)    Mass of left lung    PAD (peripheral artery disease)    Shoulder injury 11/03/1974   Special screening for malignant neoplasms, colon    Spinal stenosis of lumbar region    Stenosis of left carotid artery      SURGICAL HISTORY   Past Surgical History:  Procedure Laterality Date   CARDIAC CATHETERIZATION  2007   COLONOSCOPY  2013   KNEE SURGERY Left 1979   POLYPECTOMY  2013   TONSILLECTOMY  1964     FAMILY HISTORY   Family History  Problem Relation Age of Onset   Arthritis Mother    Heart failure Mother    Hypertension Mother    Kidney failure Mother    Colon cancer Father    Cancer Father        colon   Transient ischemic attack Father        history of     SOCIAL HISTORY   Social History   Tobacco Use   Smoking status: Former    Current packs/day: 1.00    Average packs/day: 1 pack/day for 25.0 years (25.0 ttl pk-yrs)    Types: Cigarettes   Smokeless tobacco: Never  Vaping Use   Vaping status: Never Used  Substance Use Topics   Alcohol use: No    Alcohol/week: 0.0 standard drinks of alcohol   Drug use: No     MEDICATIONS    Home Medication:  Current Outpatient Rx   Order #: [id number] Class: Normal   Order #: [id number] Class: Print   Order #: [id number] Class: Historical Med   Order #: [id number] Class: Historical Med   Order #: [id number] Class: Normal   Order #: [id number] Class: Normal   Order #: [id number] Class: Normal   Order #: [id number] Class: Historical Med   Order #: [id number] Class: Normal   Order #: [id number] Class: Normal    Current Medication:  Current Outpatient Medications:    albuterol  (VENTOLIN  HFA) 108 (90 Base) MCG/ACT inhaler, Inhale 2 puffs  into the lungs every 6 (six) hours as needed for wheezing or shortness of breath., Disp: 17 g, Rfl: 1   ALPRAZolam  (XANAX ) 0.5 MG tablet, Take 1 tablet (0.5 mg total) 3 (three) times daily as needed by mouth for anxiety., Disp: 90 tablet, Rfl: 2   amLODipine  (NORVASC ) 10 MG tablet, Take 10 mg by mouth every morning. (Patient not taking: Reported on 10/17/2024), Disp: , Rfl:    aspirin EC 325 MG tablet, Take 325 mg by mouth every 6 (six) hours as needed. For pain, Disp: , Rfl:    budesonide -formoterol  (SYMBICORT ) 160-4.5 MCG/ACT inhaler, Inhale 2 puffs into the lungs 2 (two) times daily., Disp: 1 each, Rfl: 12   clopidogrel  (PLAVIX ) 75 MG tablet, Take 1 tablet (75 mg total) by mouth daily., Disp: 30 tablet, Rfl: 6   Evolocumab  (REPATHA  SURECLICK) 140 MG/ML SOAJ, Inject 140 mg into the skin every 14 (fourteen) days., Disp: 6 mL, Rfl: 1   losartan  (COZAAR ) 100 MG tablet, Take 100 mg by mouth daily., Disp: , Rfl:    omeprazole  (PRILOSEC) 40 MG capsule, Take 1 capsule (40 mg total) by mouth daily., Disp: 30 capsule, Rfl: 5   polyethylene glycol powder (GLYCOLAX /MIRALAX ) 17 GM/SCOOP powder, Take 17 g by mouth daily. (Patient taking differently: Take 17 g by mouth as needed for moderate constipation.), Disp: 238 g, Rfl: 0    ALLERGIES   Naprosyn [naproxen]  BP 122/80   Pulse 79   Temp 98.7 F (37.1 C)   Ht 5' 7 (1.702 m)   Wt 223 lb (101.2 kg)   SpO2 96%   BMI 34.93 kg/m      Physical Examination:  General Appearance: No distress  EYES EOM intact.   NECK Supple, No Bruits Pulmonary normal BS,No wheezing, No Rhonchi.  Cardiac S1,S2.  No Murmurs Ext No edema Neuro No Focal Deficits ALL OTHER ROS ARE NEGATIVE       ASSESSMENT/PLAN   69 year old pleasant white male seen today for several months of shortness of breath, patient was recently admitted for what seems to be COPD exacerbation with an abnormal chest x-ray with hyperinflated lungs and flattened diaphragms also with a  diagnosis of hyponatremia.  CT chest reveals left lung mass patient highly suggestive of malignancy however over the last 3 months patient has not ready to obtain bronchoscopy for diagnosis due to transportation issues and issues at home    Assessment of lung mass CT chest with left lung mass highly suggestive of lung cancer Recommend bronchoscopy for definitive diagnosis however at this time patient is currently on Plavix   We will await for patient to tell us  when he is ready for bronchoscopy   Assessment & Plan Chronic obstructive pulmonary disease, unspecified COPD  type (HCC) Stable on Symbicort  No evidence of exacerbation no indication for antibiotics or prednisone     MEDICATION ADJUSTMENTS/LABS AND TESTS ORDERED: Continue Symbicort  Await for patient to call us  when he is ready for bronchoscopy    CURRENT MEDICATIONS REVIEWED AT LENGTH WITH PATIENT TODAY   Patient  satisfied with Plan of action and management. All questions answered   Follow up 3 months   I spent a total of 46 minutes dedicated to the care of this patient on the date of this encounter to include pre-visit review of records, face-to-face time with the patient discussing conditions above, post visit ordering of testing, clinical documentation with the electronic health record, making appropriate referrals as documented, and communicating necessary information to the patient's healthcare team.    The Patient requires high complexity decision making for assessment and support, frequent evaluation and titration of therapies, application of advanced monitoring technologies and extensive interpretation of multiple databases.  Patient satisfied with Plan of action and management. All questions answered    Nickolas Alm Cellar, M.D.  [facility]  Medical Director [facility]             "

## 2024-12-08 NOTE — Patient Instructions (Addendum)
 Please Let Us  Know When You Are Available to Do your Bronchoscopy. Continue Using Your Inhaler as Prescribed  Rinse Your Mouth Out After Every Use.

## 2025-03-16 ENCOUNTER — Ambulatory Visit: Admitting: Internal Medicine

## 2025-03-17 ENCOUNTER — Ambulatory Visit (INDEPENDENT_AMBULATORY_CARE_PROVIDER_SITE_OTHER): Admitting: Nurse Practitioner

## 2025-03-17 ENCOUNTER — Encounter (INDEPENDENT_AMBULATORY_CARE_PROVIDER_SITE_OTHER)

## 2025-09-15 ENCOUNTER — Encounter (INDEPENDENT_AMBULATORY_CARE_PROVIDER_SITE_OTHER)

## 2025-09-15 ENCOUNTER — Ambulatory Visit (INDEPENDENT_AMBULATORY_CARE_PROVIDER_SITE_OTHER): Admitting: Nurse Practitioner
